# Patient Record
Sex: Female | Born: 1955
Health system: Southern US, Community
[De-identification: ages and names within clinical notes are randomized; demographics above are authoritative.]

## PROBLEM LIST (undated history)

## (undated) ENCOUNTER — Ambulatory Visit (HOSPITAL_BASED_OUTPATIENT_CLINIC_OR_DEPARTMENT_OTHER)

## (undated) DIAGNOSIS — E785 Hyperlipidemia, unspecified: Secondary | ICD-10-CM

## (undated) HISTORY — PX: INGUINAL HERNIA REPAIR: SUR1180

## (undated) HISTORY — DX: Hyperlipidemia, unspecified: E78.5

---

## 2003-10-18 ENCOUNTER — Other Ambulatory Visit: Admission: RE | Admit: 2003-10-18 | Discharge: 2003-10-18 | Payer: Self-pay | Admitting: Obstetrics and Gynecology

## 2004-11-05 ENCOUNTER — Other Ambulatory Visit: Admission: RE | Admit: 2004-11-05 | Discharge: 2004-11-05 | Payer: Self-pay | Admitting: Obstetrics & Gynecology

## 2009-05-09 ENCOUNTER — Ambulatory Visit (HOSPITAL_BASED_OUTPATIENT_CLINIC_OR_DEPARTMENT_OTHER): Admission: RE | Admit: 2009-05-09 | Discharge: 2009-05-09 | Payer: Self-pay | Admitting: Orthopedic Surgery

## 2010-02-13 ENCOUNTER — Encounter
Admission: RE | Admit: 2010-02-13 | Discharge: 2010-02-13 | Payer: Self-pay | Source: Home / Self Care | Attending: Obstetrics & Gynecology | Admitting: Obstetrics & Gynecology

## 2010-05-21 LAB — POCT HEMOGLOBIN-HEMACUE: Hemoglobin: 13.4 g/dL (ref 12.0–15.0)

## 2011-02-14 ENCOUNTER — Other Ambulatory Visit: Payer: Self-pay | Admitting: Obstetrics & Gynecology

## 2011-02-14 DIAGNOSIS — R928 Other abnormal and inconclusive findings on diagnostic imaging of breast: Secondary | ICD-10-CM

## 2011-02-27 ENCOUNTER — Ambulatory Visit
Admission: RE | Admit: 2011-02-27 | Discharge: 2011-02-27 | Disposition: A | Discharge status: Home / Self Care | Payer: BC Managed Care – PPO | Source: Ambulatory Visit | Attending: Obstetrics & Gynecology | Admitting: Obstetrics & Gynecology

## 2011-02-27 ENCOUNTER — Other Ambulatory Visit: Payer: Self-pay | Admitting: Obstetrics & Gynecology

## 2011-02-27 DIAGNOSIS — R928 Other abnormal and inconclusive findings on diagnostic imaging of breast: Secondary | ICD-10-CM

## 2012-05-26 IMAGING — MG MM DIGITAL DIAGNOSTIC UNILAT*L*
3 series · 3 of 3 positions shown · non-contrast
Comparison: With priors

CLINICAL DATA: Abnormal left screening mammogram

DIGITAL DIAGNOSTIC LEFT MAMMOGRAM WITH CAD AND LEFT BREAST
ULTRASOUND:

[L MLO (1 of 2)]
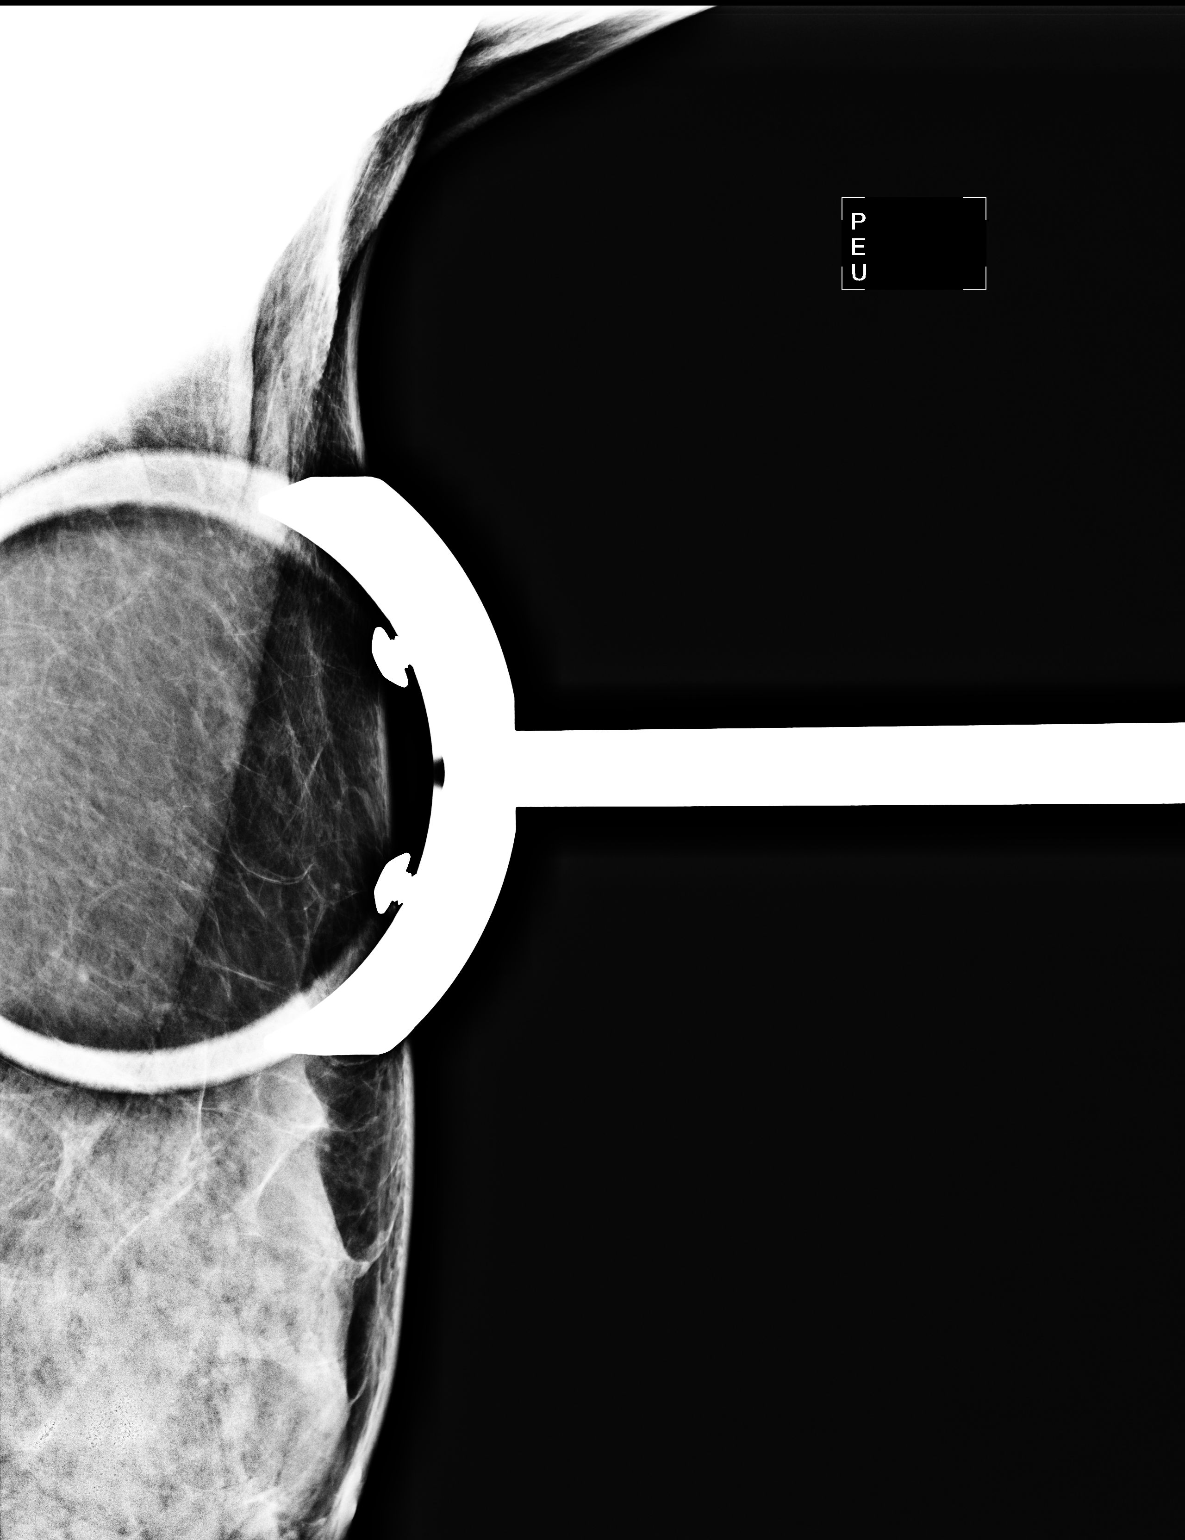

[L XCCL]
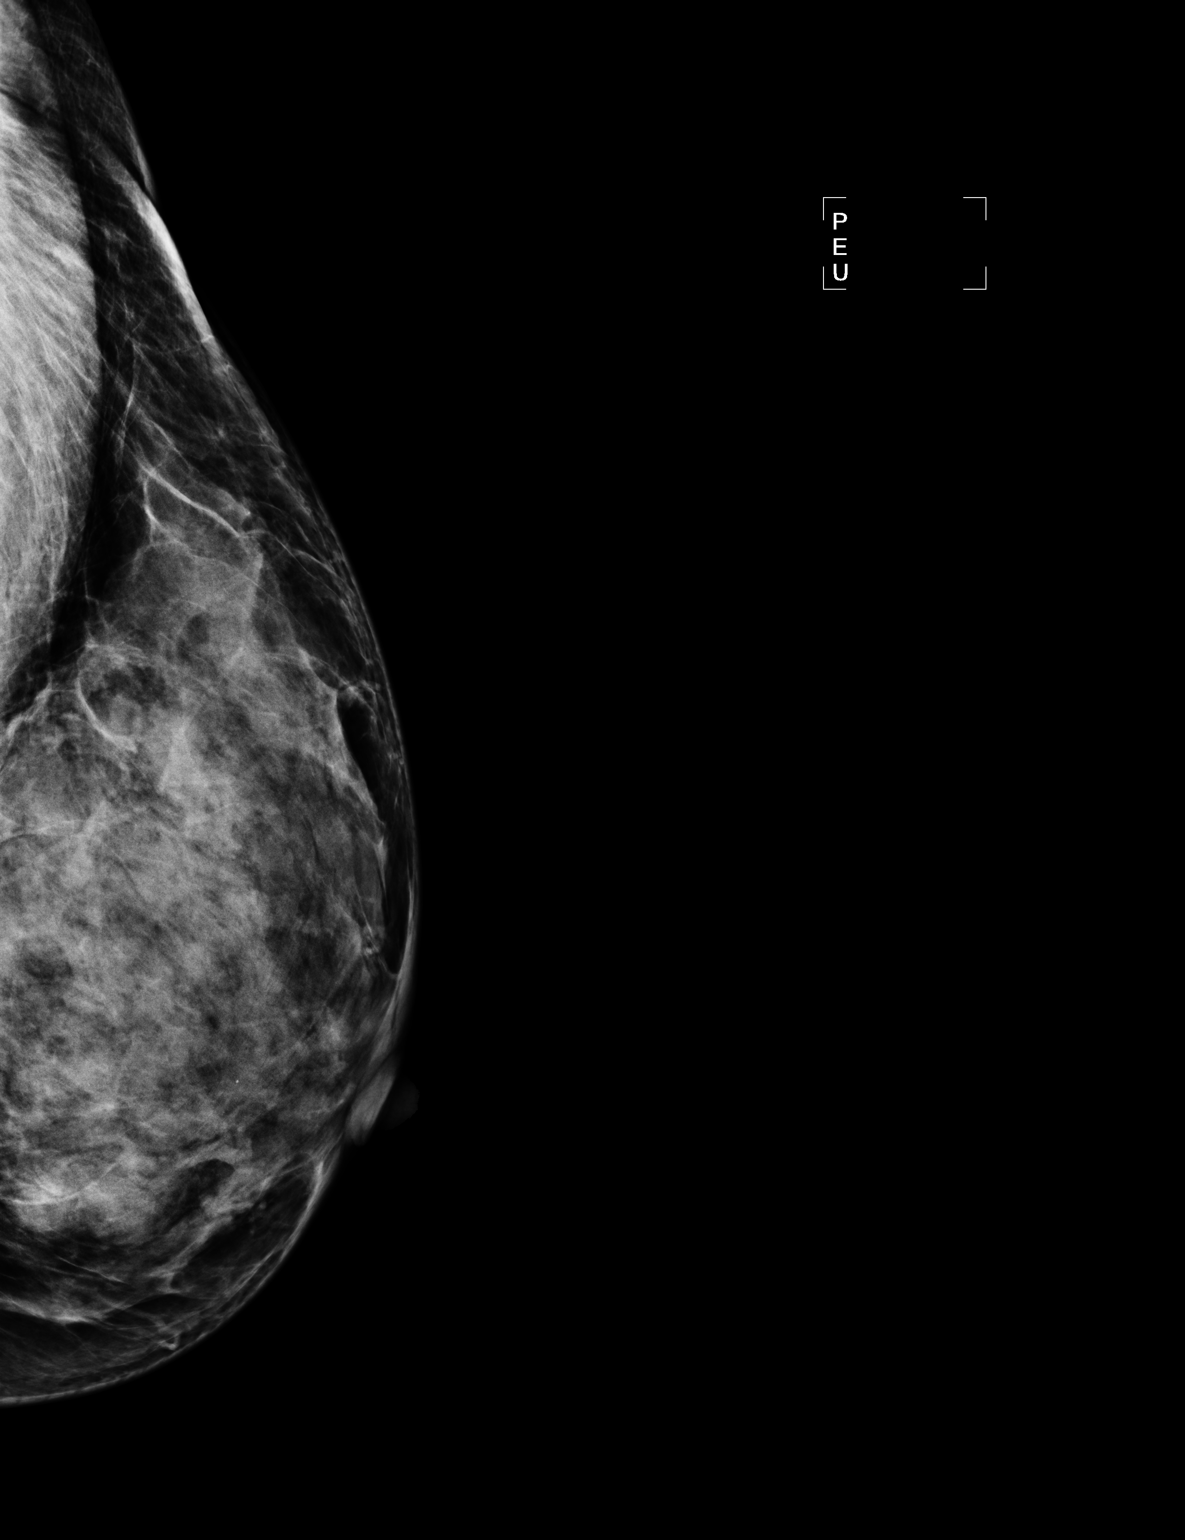

[L MLO (2 of 2)]
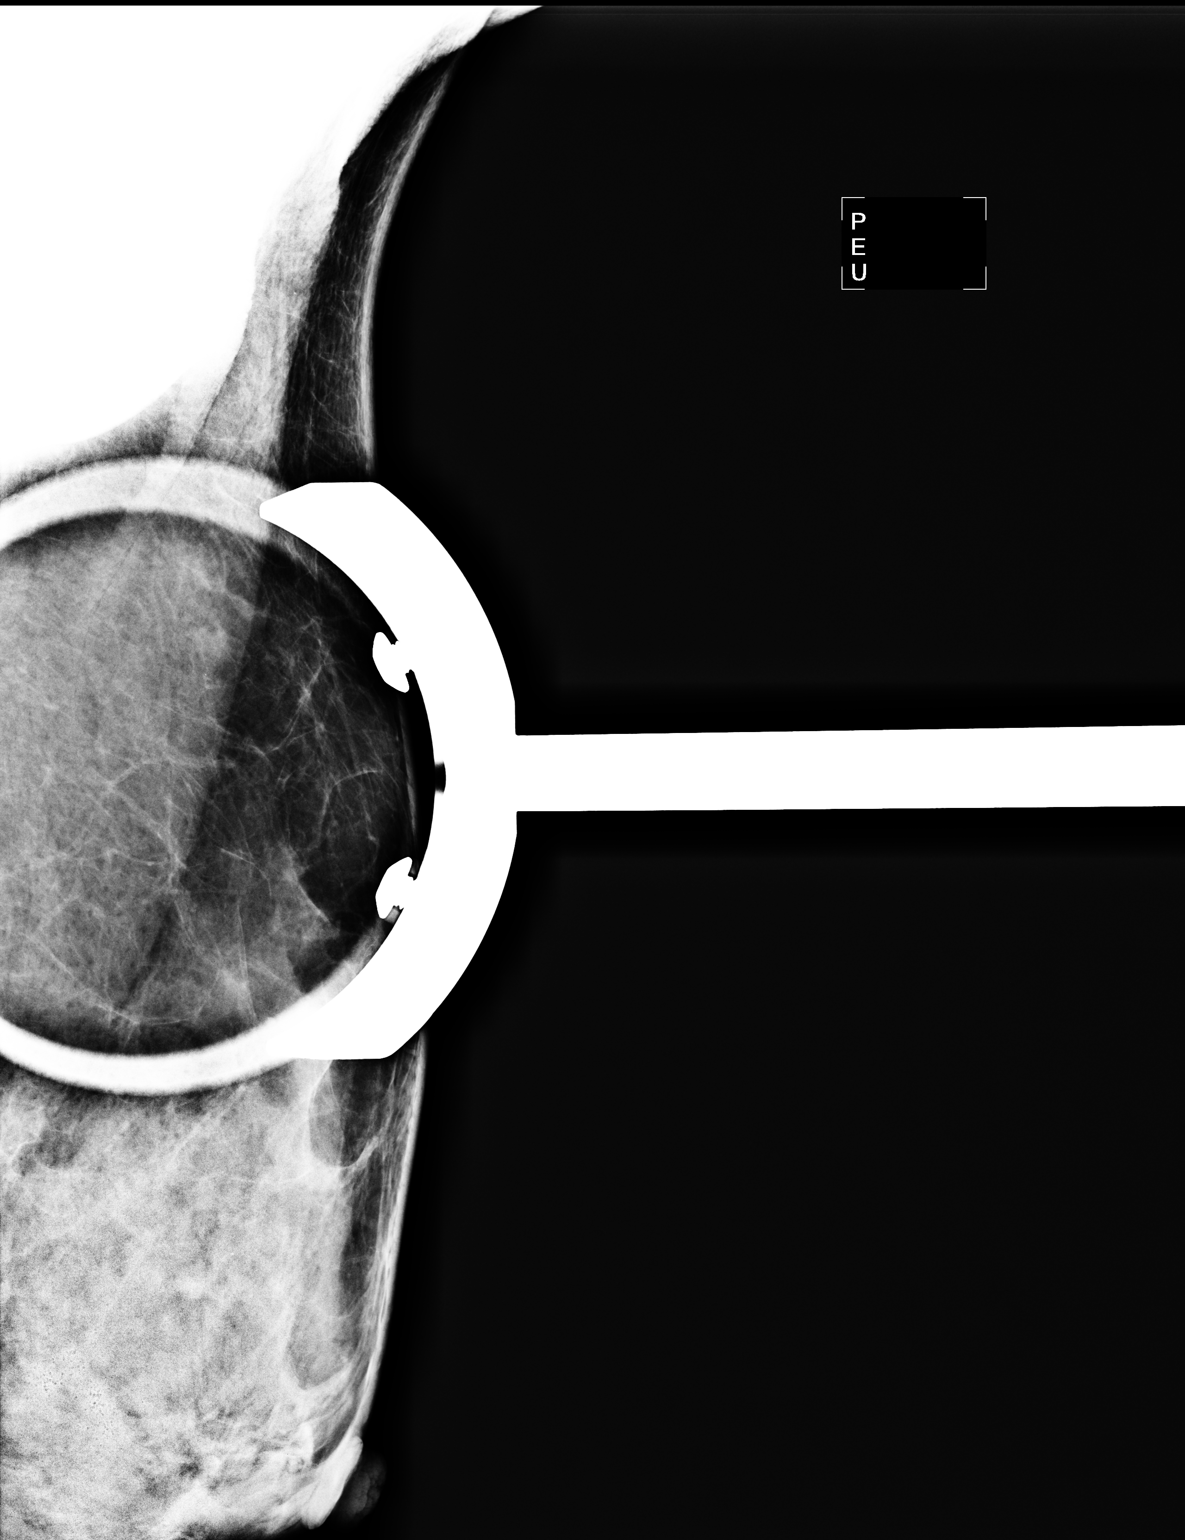

[3 of 3 positions shown; findings below may reference images not displayed]

FINDINGS: Spot compression views of the left breast were performed
as well as an exaggerated CC view.  There is no persistent mass,
distortion or malignant-type microcalcifications.
Mammographic images were processed with CAD.

On physical exam, I do not palpate a mass in the left breast.

Ultrasound is performed, showing a simple cyst in the left breast 4
o'clock 3 cm from the nipple measuring 5 x 4 x 5 mm.  No solid mass
or abnormal shadowing is seen in the left breast.
IMPRESSION: No evidence of malignancy in the left breast.  Screening mammogram
in 1 year is recommended.

BI-RADS CATEGORY 2:  Benign finding(s).

## 2012-05-26 IMAGING — US US BREAST L
1 series · 4 of 4 positions shown · non-contrast
Comparison: With priors

CLINICAL DATA: Abnormal left screening mammogram

DIGITAL DIAGNOSTIC LEFT MAMMOGRAM WITH CAD AND LEFT BREAST
ULTRASOUND:

[Series 1: us breast left · 4 of 4 slices shown]
[im 1/4]
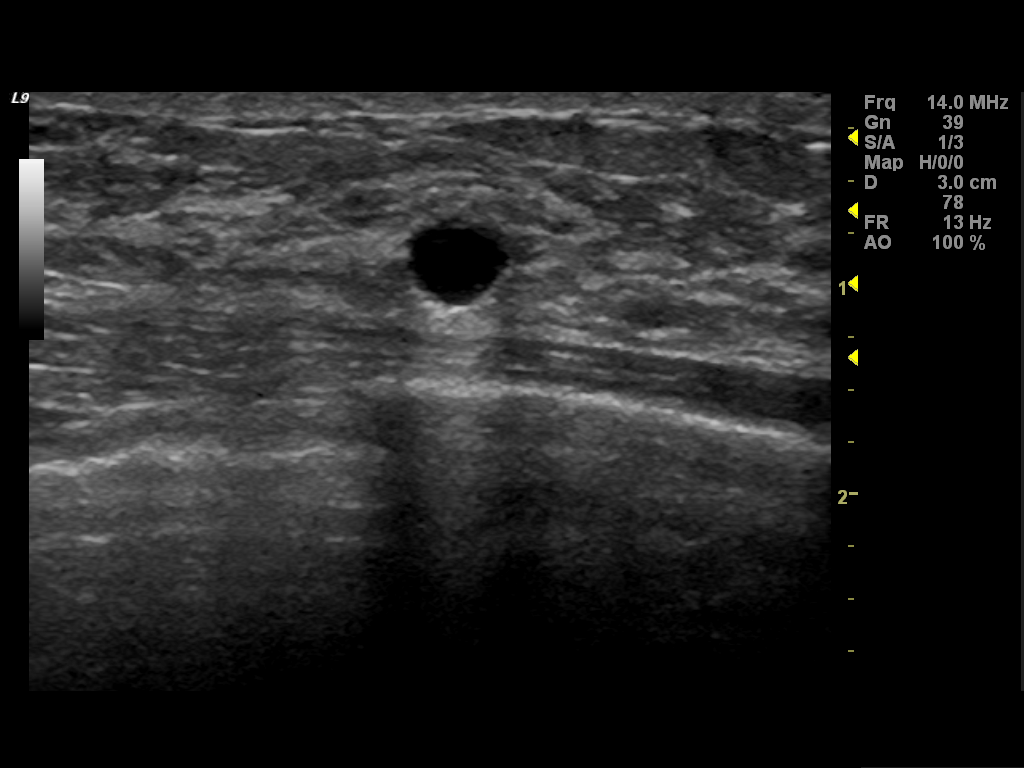
[im 2/4]
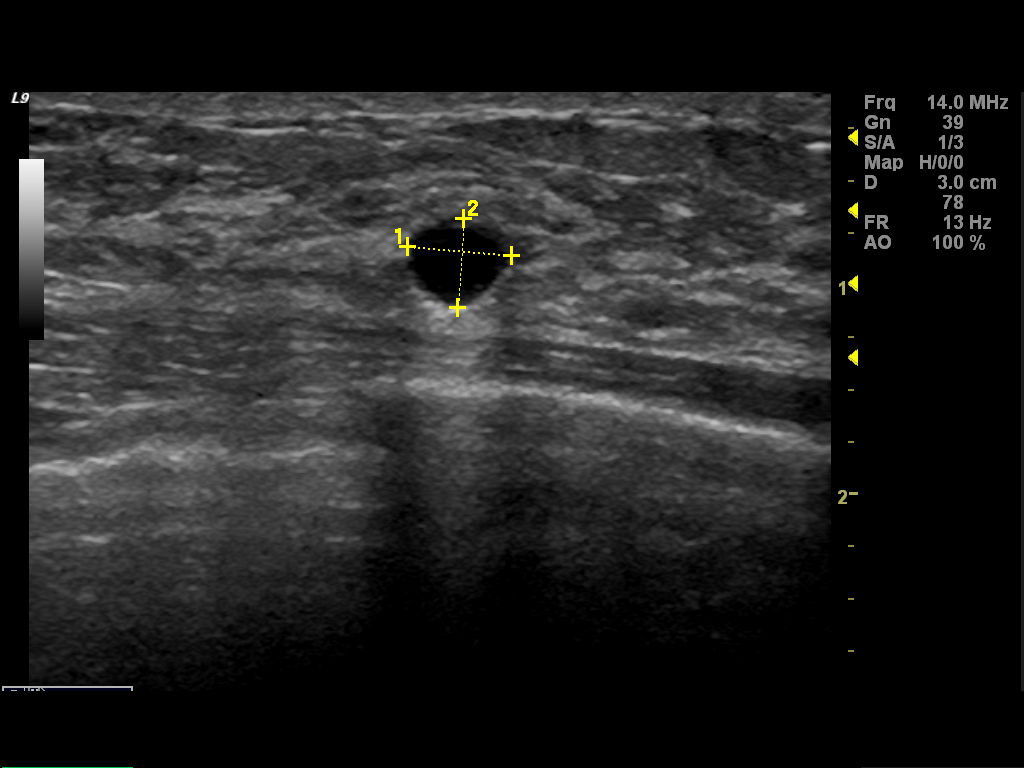
[im 3/4]
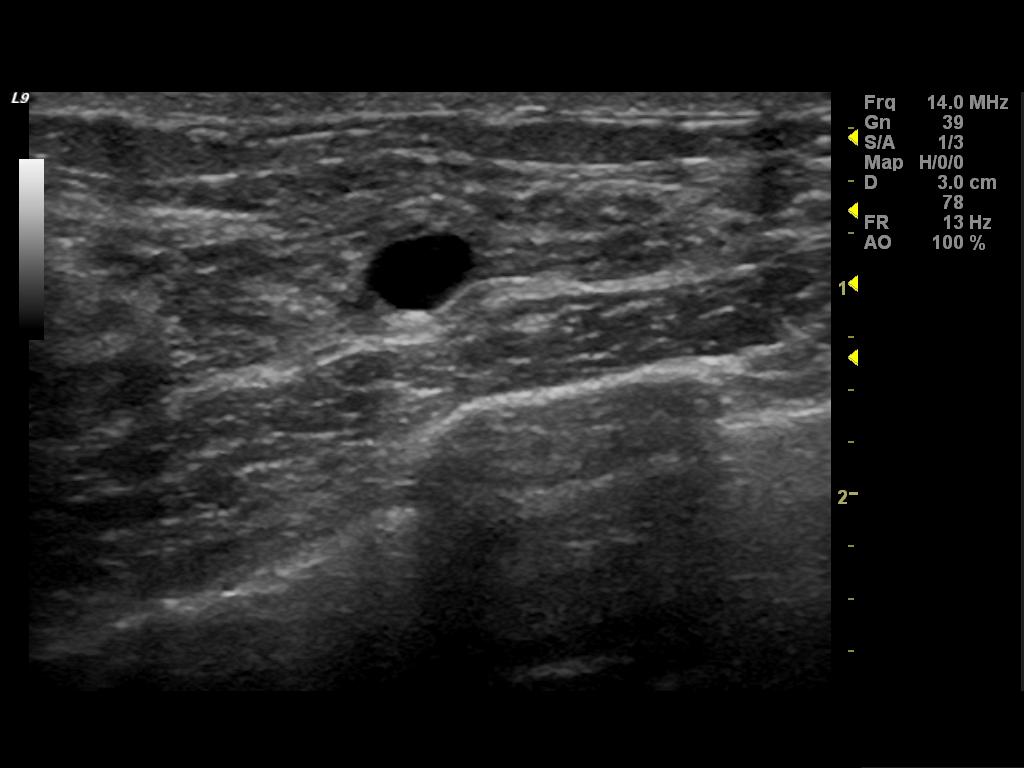
[im 4/4]
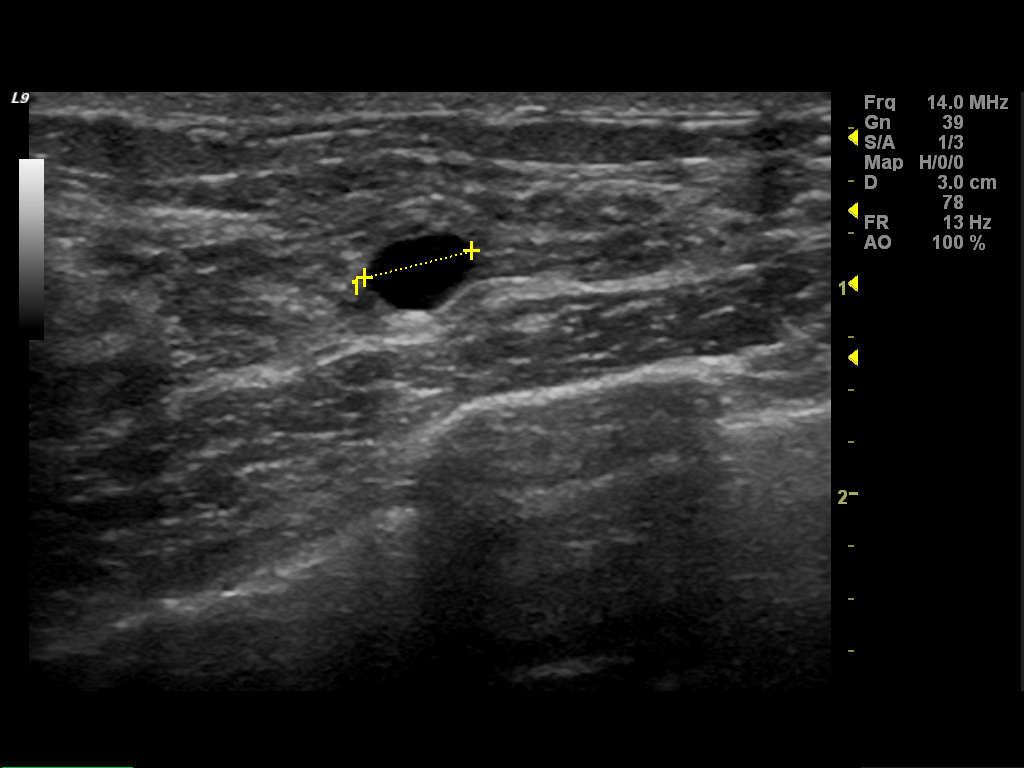

[4 of 4 positions shown; findings below may reference images not displayed]

FINDINGS: Spot compression views of the left breast were performed
as well as an exaggerated CC view.  There is no persistent mass,
distortion or malignant-type microcalcifications.
Mammographic images were processed with CAD.

On physical exam, I do not palpate a mass in the left breast.

Ultrasound is performed, showing a simple cyst in the left breast 4
o'clock 3 cm from the nipple measuring 5 x 4 x 5 mm.  No solid mass
or abnormal shadowing is seen in the left breast.
IMPRESSION: No evidence of malignancy in the left breast.  Screening mammogram
in 1 year is recommended.

BI-RADS CATEGORY 2:  Benign finding(s).

## 2014-05-17 ENCOUNTER — Encounter: Payer: Self-pay | Admitting: *Deleted

## 2014-05-18 ENCOUNTER — Ambulatory Visit (INDEPENDENT_AMBULATORY_CARE_PROVIDER_SITE_OTHER): Payer: BLUE CROSS/BLUE SHIELD | Admitting: *Deleted

## 2014-05-18 DIAGNOSIS — I8393 Asymptomatic varicose veins of bilateral lower extremities: Secondary | ICD-10-CM

## 2014-05-18 NOTE — Progress Notes (Signed)
X=.3% Sotradecol administered with a 27g butterfly.  Patient received a total of 5cc.   Treated majority of her spiders. She may need some CL in the fall to clean up tiny red vessels. Easy access. Tol well. Follow prn.  Photos: No.  Camera malfunctionCompression stockings applied: Yes.

## 2014-06-06 ENCOUNTER — Other Ambulatory Visit: Payer: Self-pay | Admitting: Obstetrics and Gynecology

## 2014-06-08 LAB — CYTOLOGY - PAP

## 2015-03-10 ENCOUNTER — Encounter: Payer: Self-pay | Admitting: *Deleted

## 2015-03-15 ENCOUNTER — Encounter: Payer: Self-pay | Admitting: Vascular Surgery

## 2015-03-15 ENCOUNTER — Ambulatory Visit (INDEPENDENT_AMBULATORY_CARE_PROVIDER_SITE_OTHER): Payer: BLUE CROSS/BLUE SHIELD | Admitting: *Deleted

## 2015-03-15 DIAGNOSIS — I8393 Asymptomatic varicose veins of bilateral lower extremities: Secondary | ICD-10-CM

## 2015-03-15 NOTE — Progress Notes (Signed)
X=.3% Sotradecol administered with a 27g butterfly.  Patient received a total of 5cc.  Cutaneous Laser:pulsed mode  810j/cm2 400 ms delay  13 ms Duration 0.5 spot  Total pulses: 1552 Total energy 2458  Total time::20  Photos: No.  Compression stockings applied: Yes.     Combo of sclero and cutaneous laser. Easy access. Tol well. Anticipate good results. Will follow prn.

## 2015-05-23 ENCOUNTER — Encounter: Payer: Self-pay | Admitting: *Deleted

## 2015-05-31 ENCOUNTER — Ambulatory Visit: Payer: BLUE CROSS/BLUE SHIELD | Admitting: *Deleted

## 2017-04-09 ENCOUNTER — Ambulatory Visit (INDEPENDENT_AMBULATORY_CARE_PROVIDER_SITE_OTHER): Payer: Self-pay | Admitting: *Deleted

## 2017-04-09 DIAGNOSIS — I781 Nevus, non-neoplastic: Secondary | ICD-10-CM

## 2017-04-09 NOTE — Progress Notes (Signed)
X=.3% Sotradecol administered with a 27g butterfly.  Patient received a total of 4cc.  Patient had a good result from her last treatment. Easy access. Tol well. Cleaned up a few new areas. Anticipate good results. Follow prn.     Compression stockings applied: Yes.

## 2017-04-10 ENCOUNTER — Encounter: Payer: Self-pay | Admitting: *Deleted

## 2017-06-05 ENCOUNTER — Ambulatory Visit: Payer: BLUE CROSS/BLUE SHIELD | Admitting: *Deleted

## 2017-06-05 DIAGNOSIS — I781 Nevus, non-neoplastic: Secondary | ICD-10-CM

## 2017-06-05 NOTE — Progress Notes (Signed)
Pt has had a good result. Just had a few red vessels which I treated with the CL at [facility]. Follow prn.

## 2017-07-30 ENCOUNTER — Ambulatory Visit: Payer: BLUE CROSS/BLUE SHIELD | Admitting: *Deleted

## 2017-07-30 DIAGNOSIS — I781 Nevus, non-neoplastic: Secondary | ICD-10-CM

## 2017-07-30 NOTE — Progress Notes (Signed)
Patient came in today to have me check the results of her recent CL tx. Does not appear to have woked on her larger capillaries. Told her one syringe should take care of her vessels. She will call for an appt in the fall. Follow prn.

## 2017-12-29 ENCOUNTER — Ambulatory Visit (INDEPENDENT_AMBULATORY_CARE_PROVIDER_SITE_OTHER): Payer: BLUE CROSS/BLUE SHIELD

## 2017-12-29 ENCOUNTER — Ambulatory Visit: Payer: BLUE CROSS/BLUE SHIELD | Admitting: Podiatry

## 2017-12-29 ENCOUNTER — Other Ambulatory Visit: Payer: Self-pay

## 2017-12-29 VITALS — BP 117/79 | HR 73

## 2017-12-29 DIAGNOSIS — M21619 Bunion of unspecified foot: Secondary | ICD-10-CM

## 2017-12-29 DIAGNOSIS — M21612 Bunion of left foot: Secondary | ICD-10-CM | POA: Diagnosis not present

## 2017-12-29 DIAGNOSIS — M21611 Bunion of right foot: Secondary | ICD-10-CM | POA: Diagnosis not present

## 2017-12-29 NOTE — Patient Instructions (Signed)
Pre-Operative Instructions  Congratulations, you have decided to take an important step towards improving your quality of life.  You can be assured that the doctors and staff at [facility] will be with you every step of the way.  Here are some important things you should know:  1. Plan to be at the surgery center/hospital at least 1 (one) hour prior to your scheduled time, unless otherwise directed by the surgical center/hospital staff.  You must have a responsible adult accompany you, remain during the surgery and drive you home.  Make sure you have directions to the surgical center/hospital to ensure you arrive on time. 2. If you are having surgery at [facility] or [facility], you will need a copy of your medical history and physical form from your family physician within one month prior to the date of surgery. We will give you a form for your primary physician to complete.  3. We make every effort to accommodate the date you request for surgery.  However, there are times where surgery dates or times have to be moved.  We will contact you as soon as possible if a change in schedule is required.   4. No aspirin/ibuprofen for one week before surgery.  If you are on aspirin, any non-steroidal anti-inflammatory medications (Mobic, Aleve, Ibuprofen) should not be taken seven (7) days prior to your surgery.  You make take Tylenol for pain prior to surgery.  5. Medications - If you are taking daily heart and blood pressure medications, seizure, reflux, allergy, asthma, anxiety, pain or diabetes medications, make sure you notify the surgery center/hospital before the day of surgery so they can tell you which medications you should take or avoid the day of surgery. 6. No food or drink after midnight the night before surgery unless directed otherwise by surgical center/hospital staff. 7. No alcoholic beverages 24-hours prior to surgery.  No smoking 24-hours prior or 24-hours after  surgery. 8. Wear loose pants or shorts. They should be loose enough to fit over bandages, boots, and casts. 9. Don't wear slip-on shoes. Sneakers are preferred. 10. Bring your boot with you to the surgery center/hospital.  Also bring crutches or a walker if your physician has prescribed it for you.  If you do not have this equipment, it will be provided for you after surgery. 11. If you have not been contacted by the surgery center/hospital by the day before your surgery, call to confirm the date and time of your surgery. 12. Leave-time from work may vary depending on the type of surgery you have.  Appropriate arrangements should be made prior to surgery with your employer. 13. Prescriptions will be provided immediately following surgery by your doctor.  Fill these as soon as possible after surgery and take the medication as directed. Pain medications will not be refilled on weekends and must be approved by the doctor. 14. Remove nail polish on the operative foot and avoid getting pedicures prior to surgery. 15. Wash the night before surgery.  The night before surgery wash the foot and leg well with water and the antibacterial soap provided. Be sure to pay special attention to beneath the toenails and in between the toes.  Wash for at least three (3) minutes. Rinse thoroughly with water and dry well with a towel.  Perform this wash unless told not to do so by your physician.  Enclosed: 1 Ice pack (please put in freezer the night before surgery)   1 Hibiclens skin cleaner     Pre-op instructions  If you have any questions regarding the instructions, please do not hesitate to call our office.  [place]: [address], [place], [place] [address] -- [phone]  [place]: [address] [place], [place] [address] -- [phone]  [place]: [address]  [place], [place] [address] -- [phone]  [place]: [address], [address], [place],  [address] -- [phone]  Website:  [email or web address] 

## 2017-12-30 NOTE — Progress Notes (Signed)
   Subjective: 62 year old female presenting today as a new patient with a chief complaint of bilateral bunions that have been present for the past several years. She states the pain in the right foot is worse than the left. Wearing shoes and walking increases the pain. She has not done anything for treatment. Patient is here for further evaluation and treatment.   No past medical history on file.    Objective: Physical Exam General: The patient is alert and oriented x3 in no acute distress.  Dermatology: Skin is cool, dry and supple bilateral lower extremities. Negative for open lesions or macerations.  Vascular: Palpable pedal pulses bilaterally. No edema or erythema noted. Capillary refill within normal limits.  Neurological: Epicritic and protective threshold grossly intact bilaterally.   Musculoskeletal Exam: Clinical evidence of bunion deformity noted to the respective foot. There is a moderate pain on palpation range of motion of the first MPJ. Lateral deviation of the hallux noted consistent with hallux abductovalgus.  Radiographic Exam: Increased intermetatarsal angle greater than 15 with a hallux abductus angle greater than 30 noted on AP view. Moderate degenerative changes noted within the first MPJ.  Assessment: 1. HAV w/ bunion deformity bilateral right greater than left     Plan of Care:  1. Patient was evaluated. X-Rays reviewed. 2. Today we discussed the conservative versus surgical management of the presenting pathology. The patient opts for surgical management. All possible complications and details of the procedure were explained. All patient questions were answered. No guarantees were expressed or implied. 3. Authorization for surgery was initiated today. Surgery will consist of bunionectomy with metatarsal osteotomy right.  4. Return to clinic one week post op.   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     Edrick Kins, DPM [facility]  Dr. Edrick Kins, [place]                                        [place], [place] [address]                Office [phone]  Fax [phone]

## 2018-02-11 ENCOUNTER — Telehealth: Payer: Self-pay | Admitting: *Deleted

## 2018-02-11 NOTE — Telephone Encounter (Signed)
"  I am calling to schedule my surgery with the cute brown headed doctor from [place]."  Are you referring to Dr. Amalia Hailey.  "Yes, I could not think of his name."  Have you signed your consent forms yet?  "No, I have not signed anything that I can recall."  Upon looking at your chart, I see you have signed your consent forms.  Do you have a date in mine that you would like, he does surgeries on Thursdays?  "I'd like to do it on February 6.  Will you check my insurance and let me know how much I will have to pay or does someone else do that?"  I will get Jocelyn Lamer in our insurance department to give you a call with that information.  You will need to call the surgical center to get the facility and anesthesia fees.  You are having an [employer] and your procedure will take one hour.  "Do I need to come back in to see him to get all the instructions?  It's been over a month since I have seen him."  Your instructions were given to you on your after visit summary that we gave you.  You cannot eat/drink anything after midnight the night before your surgery date.  Someone will need to go with you for the surgery.  You will need to clean your foot the night before with the scrub brush that we gave you.  The gel pack is to be used as an ice pack after your surgery.  You will use it 15 minutes per hour the first day, then afterwards, about four times a day.  Someone from the surgical center will give you a call a day or two prior to your surgery date and they will give you your arrival time.  You need to register online with the surgical center, the instructions on how to do that are in the brochure that we gave you from the surgical center.  "Okay, thank you for your help."  (Estimate for an [employer] - 1 hour)

## 2018-03-10 ENCOUNTER — Telehealth: Payer: Self-pay | Admitting: *Deleted

## 2018-03-10 NOTE — Telephone Encounter (Signed)
"  I'm scheduled to have surgery on February 6 for a bunion.  I have another place on my foot, I believe it's a corn.  I didn't know if I need to come in for reevaluation to see if there's something else that needs to be done.  So I can have it all done at one time.  Give me a call and let me know what I need to do."

## 2018-03-12 NOTE — Telephone Encounter (Signed)
"  I'm scheduled for surgery on February 6.  I have noticed I have developed a corn on my toe.  I want to have everything done on my foot at one time.  Should I see him in regards to this toe?"  Yes, you will need to see him.  "Will I have to pay another co-pay?"  Yes, you will have to pay another co-pay.  Would you like me to transfer you to a scheduler to make that appointment?  "Yes, that will be good."

## 2018-03-18 ENCOUNTER — Ambulatory Visit: Payer: BLUE CROSS/BLUE SHIELD | Admitting: Podiatry

## 2018-03-18 DIAGNOSIS — M21621 Bunionette of right foot: Secondary | ICD-10-CM | POA: Diagnosis not present

## 2018-03-18 DIAGNOSIS — M79671 Pain in right foot: Secondary | ICD-10-CM

## 2018-03-18 DIAGNOSIS — M21619 Bunion of unspecified foot: Secondary | ICD-10-CM | POA: Diagnosis not present

## 2018-03-26 ENCOUNTER — Telehealth: Payer: Self-pay | Admitting: *Deleted

## 2018-03-26 NOTE — Telephone Encounter (Signed)
"  I'm scheduled for surgery on February 11.  I work a part time job.  My boss wants to know how long I'm going to be out of work."  Do you stand on your job?  "Sometimes I do."  You will need to be out of work for about six to eight weeks.  "Can I be out of work for about a month?"  I can't guarantee that, it's usually six to eight weeks.  "Someone will call me about my time, correct?"  Yes, that is correct.  Someone will give you a call from the surgical center a day or two prior to your surgery date and they will give you your time.

## 2018-03-29 NOTE — Progress Notes (Signed)
   Subjective: 63 year old female presenting today with a new complaint of discomfort to the right fifth MPJ that began about one month ago. Wearing shoes increases the pain. She has been resting the foot for treatment. Patient is here for further evaluation and treatment.   No past medical history on file.    Objective: Physical Exam General: The patient is alert and oriented x3 in no acute distress.  Dermatology: Skin is cool, dry and supple bilateral lower extremities. Negative for open lesions or macerations.  Vascular: Palpable pedal pulses bilaterally. No edema or erythema noted. Capillary refill within normal limits.  Neurological: Epicritic and protective threshold grossly intact bilaterally.   Musculoskeletal Exam: Clinical evidence of bunion deformity noted to the respective foot. There is a moderate pain on palpation range of motion of the first MPJ. Lateral deviation of the hallux noted consistent with hallux abductovalgus. Clinical evidence of Tailor's bunion deformity noted to the respective foot. There is a moderate pain on palpation range of motion of the fifth MPJ.  Assessment: 1. 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 Tailors bunion right    Plan of Care:  1. Patient was evaluated. 2. Authorization for surgery adjusted today to address Tailors bunion. Authorization for surgery reinitiated. Surgery will consist of bunionectomy with metatarsal osteotomy right; Tailors bunionectomy with 5th metatarsal osteotomy right.  3. Return to clinic one week post op.   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     Edrick Kins, DPM [facility]  Dr. Edrick Kins, [place]                                        [place], [place] [address]                Office [phone]  Fax [phone]

## 2018-03-30 ENCOUNTER — Telehealth: Payer: Self-pay | Admitting: *Deleted

## 2018-03-30 NOTE — Telephone Encounter (Signed)
"  I am calling to see if I really need to remove my nail polish.  How strict are they about that?"  Yes, you do need to remove your toenail polish.  The reason is that leaving the nail polish on can lead to infection.  "So I can leave it on my hands just not my toenails?"  Yes, that is correct.

## 2018-04-02 ENCOUNTER — Other Ambulatory Visit: Payer: Self-pay | Admitting: Podiatry

## 2018-04-02 ENCOUNTER — Encounter: Payer: Self-pay | Admitting: Podiatry

## 2018-04-02 DIAGNOSIS — M2011 Hallux valgus (acquired), right foot: Secondary | ICD-10-CM

## 2018-04-02 DIAGNOSIS — M21541 Acquired clubfoot, right foot: Secondary | ICD-10-CM

## 2018-04-02 MED ORDER — HYDROCODONE-ACETAMINOPHEN 5-325 MG PO TABS: 1.0000 | ORAL_TABLET | Freq: Four times a day (QID) | ORAL | 0 refills | Status: DC | PRN

## 2018-04-02 NOTE — Progress Notes (Signed)
.  postop

## 2018-04-08 ENCOUNTER — Ambulatory Visit (INDEPENDENT_AMBULATORY_CARE_PROVIDER_SITE_OTHER): Payer: Self-pay | Admitting: Podiatry

## 2018-04-08 ENCOUNTER — Ambulatory Visit (INDEPENDENT_AMBULATORY_CARE_PROVIDER_SITE_OTHER): Payer: BLUE CROSS/BLUE SHIELD

## 2018-04-08 VITALS — BP 118/73 | Temp 96.9°F

## 2018-04-08 DIAGNOSIS — M79671 Pain in right foot: Secondary | ICD-10-CM

## 2018-04-08 DIAGNOSIS — M21621 Bunionette of right foot: Secondary | ICD-10-CM

## 2018-04-08 DIAGNOSIS — Z09 Encounter for follow-up examination after completed treatment for conditions other than malignant neoplasm: Secondary | ICD-10-CM

## 2018-04-13 NOTE — Progress Notes (Signed)
   Subjective:  Patient presents today status post bunionectomy and Tailor's bunionectomy right. DOS: 04/02/2018. She states she is doing well. She reports some intermittent pain but denies any modifying factors. She has been using the CAM boot as directed. Patient is here for further evaluation and treatment.    No past medical history on file.    Objective/Physical Exam Neurovascular status intact.  Skin incisions appear to be well coapted with sutures and staples intact. No sign of infectious process noted. No dehiscence. No active bleeding noted. Moderate edema noted to the surgical extremity.  Radiographic Exam:  Orthopedic hardware and osteotomies sites appear to be stable with routine healing.  Assessment: 1. s/p bunionectomy and Tailor's bunionectomy right. DOS: 04/02/2018   Plan of Care:  1. Patient was evaluated. X-rays reviewed 2. Dressing changed. Keep clean, dry and intact for one week.  3. Continue weightbearing in CAM boot.  4. Return to clinic in one week.    Edrick Kins, DPM [facility]  Dr. Edrick Kins, [place]                                        [place], [place] [address]                Office [phone]  Fax [phone]

## 2018-04-20 ENCOUNTER — Ambulatory Visit (INDEPENDENT_AMBULATORY_CARE_PROVIDER_SITE_OTHER): Payer: BLUE CROSS/BLUE SHIELD | Admitting: Podiatry

## 2018-04-20 DIAGNOSIS — Z09 Encounter for follow-up examination after completed treatment for conditions other than malignant neoplasm: Secondary | ICD-10-CM

## 2018-04-20 DIAGNOSIS — M21621 Bunionette of right foot: Secondary | ICD-10-CM

## 2018-04-20 DIAGNOSIS — M21619 Bunion of unspecified foot: Secondary | ICD-10-CM

## 2018-04-22 NOTE — Progress Notes (Signed)
   Subjective:  Patient presents today status post bunionectomy and Tailor's bunionectomy right. DOS: 04/02/2018. She states she is doing well overall. She denies any significant pain or modifying factors. She has been using the CAM boot as directed. Patient is here for further evaluation and treatment.    No past medical history on file.    Objective/Physical Exam Neurovascular status intact.  Skin incisions appear to be well coapted with sutures and staples intact. No sign of infectious process noted. No dehiscence. No active bleeding noted. Moderate edema noted to the surgical extremity.  Radiographic Exam:  Orthopedic hardware and osteotomies sites appear to be stable with routine healing.  Assessment: 1. s/p bunionectomy and Tailor's bunionectomy right. DOS: 04/02/2018   Plan of Care:  1. Patient was evaluated. X-rays reviewed 2. Sutures removed.  3. Post op shoe dispensed. Discontinue using CAM boot.  4. Compression anklet dispensed.  5. Return to clinic in 2 weeks.    Edrick Kins, DPM [facility]  Dr. Edrick Kins, [place]                                        [place], [place] [address]                Office [phone]  Fax [phone]

## 2018-05-04 ENCOUNTER — Ambulatory Visit (INDEPENDENT_AMBULATORY_CARE_PROVIDER_SITE_OTHER): Payer: BLUE CROSS/BLUE SHIELD | Admitting: Podiatry

## 2018-05-04 ENCOUNTER — Encounter: Payer: Self-pay | Admitting: Podiatry

## 2018-05-04 DIAGNOSIS — Z09 Encounter for follow-up examination after completed treatment for conditions other than malignant neoplasm: Secondary | ICD-10-CM

## 2018-05-04 DIAGNOSIS — M21621 Bunionette of right foot: Secondary | ICD-10-CM

## 2018-05-04 DIAGNOSIS — M21619 Bunion of unspecified foot: Secondary | ICD-10-CM

## 2018-05-07 NOTE — Progress Notes (Signed)
   Subjective:  Patient presents today status post bunionectomy and Tailor's bunionectomy right. DOS: 04/02/2018. She states she is doing well overall. She denies any pain or modifying factors. She has been using the CAM boot as directed. Patient is here for further evaluation and treatment.    History reviewed. No pertinent past medical history.    Objective/Physical Exam Neurovascular status intact.  Skin incisions appear to be well coapted. No sign of infectious process noted. No dehiscence. No active bleeding noted. Moderate edema noted to the surgical extremity.  Assessment: 1. s/p bunionectomy and Tailor's bunionectomy right. DOS: 04/02/2018   Plan of Care:  1. Patient was evaluated. 2. Continue weightbearing in CAM boot for two weeks. Then transition into sneakers.  3. Begin ROM exercises to great toe.  4. Continue using compression anklet.  5. Return to clinic in 4 weeks.    Edrick Kins, DPM [facility]  Dr. Edrick Kins, [place]                                        [place], [place] [address]                Office [phone]  Fax [phone]

## 2018-05-20 ENCOUNTER — Ambulatory Visit (INDEPENDENT_AMBULATORY_CARE_PROVIDER_SITE_OTHER): Payer: Self-pay | Admitting: Plastic Surgery

## 2018-05-20 ENCOUNTER — Other Ambulatory Visit: Payer: Self-pay

## 2018-05-20 ENCOUNTER — Ambulatory Visit (INDEPENDENT_AMBULATORY_CARE_PROVIDER_SITE_OTHER): Payer: BLUE CROSS/BLUE SHIELD | Admitting: Plastic Surgery

## 2018-05-20 ENCOUNTER — Encounter: Payer: Self-pay | Admitting: Plastic Surgery

## 2018-05-20 VITALS — BP 127/86 | HR 79 | Temp 98.5°F | Ht 64.0 in | Wt 146.0 lb

## 2018-05-20 DIAGNOSIS — D1721 Benign lipomatous neoplasm of skin and subcutaneous tissue of right arm: Secondary | ICD-10-CM | POA: Insufficient documentation

## 2018-05-20 DIAGNOSIS — Z719 Counseling, unspecified: Secondary | ICD-10-CM | POA: Insufficient documentation

## 2018-05-20 NOTE — Progress Notes (Signed)
Patient ID: Vanessa Harris, female    DOB: 18-Jun-1955, 63 y.o.   MRN: [id number]   Chief Complaint  Patient presents with  . Skin Problem    The patient is a 63 year old female here for evaluation of her right arm mass.  The patient states that she had a mass in that area in the past.  It was removed by a different physician.  She noticed it growing over the past year.  It is now 6 x 6 cm and seems to be getting larger.  It does not appear to be infected.  It is soft slightly mobile.  It is most likely a lipoma.  Its not painful but she is concerned with the increase in size.  She denies any trauma.   Review of Systems  Constitutional: Negative.   HENT: Negative.   Eyes: Negative.   Respiratory: Negative.  Negative for shortness of breath.   Cardiovascular: Negative.  Negative for leg swelling.  Gastrointestinal: Negative.  Negative for abdominal pain.  Endocrine: Negative.   Genitourinary: Negative.   Musculoskeletal: Negative.  Negative for joint swelling.  Neurological: Negative.  Negative for facial asymmetry.  Hematological: Negative.   Psychiatric/Behavioral: Negative.     History reviewed. No pertinent past medical history.  History reviewed. No pertinent surgical history.    Current Outpatient Medications:  .  buPROPion (WELLBUTRIN SR) 150 MG 12 hr tablet, TK 1 T PO BID, Disp: , Rfl: 11 .  estradiol (VIVELLE-DOT) 0.05 MG/24HR patch, , Disp: , Rfl: 7 .  FABIOR 0.1 % FOAM, APPLY TO AFFECTED AREAS EVERY NIGHT AT BEDTIME, Disp: , Rfl: 2 .  fluconazole (DIFLUCAN) 100 MG tablet, TK 1 T PO D, Disp: , Rfl:  .  HYDROcodone-acetaminophen (NORCO/VICODIN) 5-325 MG tablet, Take 1 tablet by mouth every 6 (six) hours as needed for moderate pain. (Patient not taking: Reported on 05/20/2018), Disp: 30 tablet, Rfl: 0 .  ivermectin (STROMECTOL) 3 MG TABS tablet, , Disp: , Rfl:  .  mupirocin ointment (BACTROBAN) 2 %, APP AA TID FOR 10 TO 14 DAYS, Disp: , Rfl: 0 .  permethrin  (ELIMITE) 5 % cream, , Disp: , Rfl:  .  progesterone (PROMETRIUM) 100 MG capsule, , Disp: , Rfl: 10 .  spironolactone (ALDACTONE) 50 MG tablet, TK 1 T PO  QD, Disp: , Rfl: 7 .  Vitamin D, Ergocalciferol, (DRISDOL) 50000 units CAPS capsule, TAKE 1 CAPSULE PO ONCE WEEKLY, Disp: , Rfl: 3   Objective:   Vitals:   05/20/18 1603  BP: 127/86  Pulse: 79  Temp: 98.5 F (36.9 C)  SpO2: 98%    Physical Exam Vitals signs and nursing note reviewed.  Constitutional:      Appearance: Normal appearance.  HENT:     Head: Normocephalic and atraumatic.     Nose: Nose normal.     Mouth/Throat:     Mouth: Mucous membranes are moist.  Eyes:     Extraocular Movements: Extraocular movements intact.  Cardiovascular:     Rate and Rhythm: Normal rate.  Pulmonary:     Effort: Pulmonary effort is normal.  Abdominal:     General: Abdomen is flat. There is no distension.     Tenderness: There is no abdominal tenderness.  Musculoskeletal:       Arms:  Skin:    General: Skin is warm.  Neurological:     General: No focal deficit present.     Mental Status: She is alert and oriented to person,  place, and time.  Psychiatric:        Mood and Affect: Mood normal.        Behavior: Behavior normal.     Assessment & Plan:  Lipoma of right upper extremity Recommend excision of right arm lipoma.  [place], DO

## 2018-05-20 NOTE — Progress Notes (Signed)
Botulinum Toxin Procedure Note  Procedure: Cosmetic botulinum toxin   Pre-operative Diagnosis: Dynamic rhytides   Post-operative Diagnosis: Same  Complications:  None  Brief history: The patient desires botulinum toxin injection of her forehead. I discussed with the patient this proposed procedure of botulinum toxin injections, which is customized depending on the particular needs of the patient. It is performed on facial rhytids as a temporary correction. The alternatives were discussed with the patient. The risks were addressed including bleeding, scarring, infection, damage to deeper structures, asymmetry, and chronic pain, which may occur infrequently after a procedure. The individual's choice to undergo a surgical procedure is based on the comparison of risks to potential benefits. Other risks include unsatisfactory results, brow ptosis, eyelid ptosis, allergic reaction, temporary paralysis, which should go away with time, bruising, blurring disturbances and delayed healing. Botulinum toxin injections do not arrest the aging process or produce permanent tightening of the eyelid.  Operative intervention maybe necessary to maintain the results of a blepharoplasty or botulinum toxin. The patient understands and wishes to proceed. An informed consent was signed and informational brochures given to her prior to the procedure.  Procedure: The area was prepped with alcohol and dried with a clean gauze. Using a clean technique, the botulinum toxin was diluted with 1.25 cc of preservative-free normal saline which was slowly injected with an 18 gauge needle in a tuberculin syringes.  A 30 gauge needles were then used to inject the botulinum toxin. This mixture allow for an aliquot of 5 units per 0.1 cc in each injection site.    Subsequently the mixture was injected in the glabellar area. Then the lateral eyebrow as well as into each lateral canthal areas. A total of 15 Units of botulinum toxin was used. No  complications were noted. Light pressure was held for 5 minutes. She was instructed explicitly in post-operative care.  Botox LOT:  [id number] C2 EXP:  8/22

## 2018-05-20 NOTE — Progress Notes (Signed)
   Subjective:    Patient ID: Vanessa Harris, female    DOB: 04/10/1955, 63 y.o.   MRN: [id number]  The patient is a 63 year old female here for evaluation of her face.  She had a neck lift with a doctor in [place] 3 years ago.  She does not like the wrinkling around her periorbital area that she has developed in the past couple of years.  She also had laser but she is not sure if it was [place] or if it was a CO2 laser.  She is not interested in surgery but would like to have minimally invasive procedures. She has hyperpigmentation of the face and lower extremities.  She is interested in laser.   Review of Systems  Constitutional: Negative.  Negative for activity change and appetite change.  HENT: Negative.   Eyes: Negative.   Respiratory: Negative.   Cardiovascular: Negative.   Gastrointestinal: Negative.   Genitourinary: Negative.   Musculoskeletal: Negative.   Skin: Positive for color change. Negative for wound.  Hematological: Negative.   Psychiatric/Behavioral: Negative.        Objective:   Physical Exam Vitals signs and nursing note reviewed.  Constitutional:      Appearance: Normal appearance.  HENT:     Head: Normocephalic and atraumatic.  Cardiovascular:     Rate and Rhythm: Normal rate.  Pulmonary:     Effort: Pulmonary effort is normal.  Skin:    General: Skin is warm.  Neurological:     General: No focal deficit present.     Mental Status: She is alert and oriented to person, place, and time.  Psychiatric:        Mood and Affect: Mood normal.        Behavior: Behavior normal.        Assessment & Plan:  Encounter for counseling Botox given Quote for laser of legs and face. Elta MD face cream and eye cream with [facility]ente.

## 2018-05-26 ENCOUNTER — Institutional Professional Consult (permissible substitution): Payer: BLUE CROSS/BLUE SHIELD | Admitting: Plastic Surgery

## 2018-06-08 ENCOUNTER — Encounter: Payer: BLUE CROSS/BLUE SHIELD | Admitting: Podiatry

## 2018-06-09 ENCOUNTER — Telehealth: Payer: Self-pay | Admitting: Plastic Surgery

## 2018-06-09 NOTE — Telephone Encounter (Signed)
Called patient to confirm appointment scheduled for tomorrow. Patient answered the following questions: °1.Has the patient traveled outside of the state of [place] at all within the past 6 weeks? No °2.Does the patient have a fever or cough at all? No °3.Has the patient been tested for COVID? Had a positive COVID test? No °4. Has the patient been in contact with anyone who has tested positive? No ° °

## 2018-06-10 ENCOUNTER — Ambulatory Visit (INDEPENDENT_AMBULATORY_CARE_PROVIDER_SITE_OTHER): Payer: BLUE CROSS/BLUE SHIELD | Admitting: Podiatry

## 2018-06-10 ENCOUNTER — Encounter: Payer: Self-pay | Admitting: Plastic Surgery

## 2018-06-10 ENCOUNTER — Other Ambulatory Visit: Payer: Self-pay

## 2018-06-10 ENCOUNTER — Ambulatory Visit (INDEPENDENT_AMBULATORY_CARE_PROVIDER_SITE_OTHER): Payer: Self-pay | Admitting: Plastic Surgery

## 2018-06-10 ENCOUNTER — Ambulatory Visit (INDEPENDENT_AMBULATORY_CARE_PROVIDER_SITE_OTHER): Payer: BLUE CROSS/BLUE SHIELD

## 2018-06-10 VITALS — BP 112/77 | HR 78 | Temp 98.6°F | Ht 64.0 in | Wt 147.0 lb

## 2018-06-10 VITALS — Temp 97.3°F

## 2018-06-10 DIAGNOSIS — Z09 Encounter for follow-up examination after completed treatment for conditions other than malignant neoplasm: Secondary | ICD-10-CM

## 2018-06-10 DIAGNOSIS — M21621 Bunionette of right foot: Secondary | ICD-10-CM

## 2018-06-10 DIAGNOSIS — M21619 Bunion of unspecified foot: Secondary | ICD-10-CM | POA: Diagnosis not present

## 2018-06-10 DIAGNOSIS — Z719 Counseling, unspecified: Secondary | ICD-10-CM

## 2018-06-10 NOTE — Progress Notes (Signed)
Preoperative Dx: hyperpigmentation  Postoperative Dx:  same  Procedure: laser to hyperpigmentation of legs   Anesthesia: none  Description of Procedure:  Risks and complications were explained to the patient. Consent was confirmed and signed. Time out was called and all information was confirmed to be correct. The area  area was prepped with alcohol and wiped dry. The IPL laser was set at 7.4 J/cm2. The anterior legs were lasered. The patient tolerated the procedure well and there were no complications. The patient is to follow up in 4 weeks.   Botulinum Toxin Procedure Note  Procedure: Cosmetic botulinum toxin  Pre-operative Diagnosis: Dynamic rhytides   Post-operative Diagnosis: Same  Complications:  None  Brief history: The patient desires botulinum toxin injection of her forehead. I discussed with the patient this proposed procedure of botulinum toxin injections, which is customized depending on the particular needs of the patient. It is performed on facial rhytids as a temporary correction. The alternatives were discussed with the patient. The risks were addressed including bleeding, scarring, infection, damage to deeper structures, asymmetry, and chronic pain, which may occur infrequently after a procedure. The individual's choice to undergo a surgical procedure is based on the comparison of risks to potential benefits. Other risks include unsatisfactory results, brow ptosis, eyelid ptosis, allergic reaction, temporary paralysis, which should go away with time, bruising, blurring disturbances and delayed healing. Botulinum toxin injections do not arrest the aging process or produce permanent tightening of the eyelid.  Operative intervention maybe necessary to maintain the results of a blepharoplasty or botulinum toxin. The patient understands and wishes to proceed. An informed consent was signed and informational brochures given to her prior to the procedure.  Procedure: The area was  prepped with alcohol and dried with a clean gauze. Using a clean technique, the botulinum toxin was diluted with 1.25 cc of preservative-free normal saline which was slowly injected with an 18 gauge needle in a tuberculin syringes.  A 32 gauge needles were then used to inject the botulinum toxin. This mixture allow for an aliquot of 5 units per 0.1 cc in each injection site.    Subsequently the mixture was injected in the glabellar and forehead area with preservation of the temporal branch to the lateral eyebrow as well as into each lateral canthal area beginning from the lateral orbital rim medial to the zygomaticus major in 3 separate areas. A total of 15 Units of botulinum toxin was used. The forehead and glabellar area was injected with care to inject intramuscular only while holding pressure on the supratrochlear vessels in each area during each injection on either side of the medial corrugators. The injection proceeded vertically superiorly to the medial 2/3 of the frontalis muscle and superior 2/3 of the lateral frontalis, again with preservation of the frontal branch.  No complications were noted. Light pressure was held for 5 minutes. She was instructed explicitly in post-operative care.  Botox LOT:  [id number] C2 EXP:  5/22

## 2018-06-10 NOTE — Patient Instructions (Signed)
Mederma Scar Cream

## 2018-06-10 NOTE — Progress Notes (Signed)
   Subjective:  Patient presents today status post bunionectomy and Tailor's bunionectomy right. DOS: 04/02/2018.  Patient states that she has been doing a lot of exercise recently and she does have some swelling to the surgical foot.  Patient denies any significant pain.  No past medical history on file.    Objective/Physical Exam Neurovascular status intact.  Skin incisions appear to be well coapted. No sign of infectious process noted. No dehiscence.  There is a moderate edema around the surgical foot.  Negative for any pain on palpation.  Range of motion to the first MPJ is doing well.  Radiographic exam Osteotomies demonstrate routine healing with orthopedic hardware intact.  The proximal screw the first metatarsal does appear to be slightly backing out.  We will simply observe for the moment there is also some callus formation around the osteotomy sites consistent with patient's history of increased activity which likely created some movement to the osteotomy sites and callus formation.  Assessment: 1. s/p bunionectomy and Tailor's bunionectomy right. DOS: 04/02/2018   Plan of Care:  1. Patient was evaluated. 2.  Continue wearing new balance walking shoes 3.  Explained to the patient that she does need to reduce her activity somewhat.  Begin with just simple walking.  Discontinue aerobic/high impact activities for an additional 3 weeks to allow for healing. 4.  Recommend Mederma scar cream OTC 5.  I did explain to the patient that if the screws continues to back out we will remove the screw here in the office. 6.  Return to clinic in 8 weeks for final follow-up x-rays   Edrick Kins, DPM [facility]  Dr. Edrick Kins, [address]                                        [place], [place] [address]                Office [phone]  Fax [phone]

## 2018-06-15 ENCOUNTER — Telehealth: Payer: Self-pay | Admitting: Plastic Surgery

## 2018-06-15 NOTE — Telephone Encounter (Signed)
Patient left voicemail. Patient is asking about numbing cream. Patient wants to know if there can be some numbing cream called in for next procedure on her face scheduled for 06/30/18.

## 2018-06-16 NOTE — Telephone Encounter (Signed)
RX for Emla cream sent in to pharmacy by Dr. Rita Ohara

## 2018-06-23 MED ORDER — LIDOCAINE-PRILOCAINE 2.5-2.5 % EX CREA: 1.0000 "application " | TOPICAL_CREAM | CUTANEOUS | 0 refills | Status: DC | PRN

## 2018-06-23 NOTE — Addendum Note (Signed)
Addended by: Wallace Going on: 06/23/2018 03:31 PM   Modules accepted: Orders

## 2018-06-30 ENCOUNTER — Ambulatory Visit: Payer: BLUE CROSS/BLUE SHIELD | Admitting: Plastic Surgery

## 2018-07-07 ENCOUNTER — Encounter (HOSPITAL_BASED_OUTPATIENT_CLINIC_OR_DEPARTMENT_OTHER): Admission: RE | Payer: Self-pay | Source: Home / Self Care

## 2018-07-07 ENCOUNTER — Ambulatory Visit (HOSPITAL_BASED_OUTPATIENT_CLINIC_OR_DEPARTMENT_OTHER)
Admission: RE | Admit: 2018-07-07 | Payer: BLUE CROSS/BLUE SHIELD | Source: Home / Self Care | Admitting: Plastic Surgery

## 2018-07-07 SURGERY — EXCISION LIPOMA
Anesthesia: Choice | Site: Arm Upper | Laterality: Right

## 2018-07-09 ENCOUNTER — Telehealth: Payer: Self-pay | Admitting: Plastic Surgery

## 2018-07-09 NOTE — Telephone Encounter (Signed)
Called patient to confirm appointment scheduled for tomorrow. Patient answered the following questions: °1.Has the patient traveled outside of the state of [place] at all within the past 6 weeks? No °2.Does the patient have a fever or cough at all? No °3.Has the patient been tested for COVID? Had a positive COVID test? No °4. Has the patient been in contact with anyone who has tested positive? No ° °

## 2018-07-10 ENCOUNTER — Other Ambulatory Visit: Payer: Self-pay

## 2018-07-10 ENCOUNTER — Ambulatory Visit (INDEPENDENT_AMBULATORY_CARE_PROVIDER_SITE_OTHER): Payer: Self-pay | Admitting: Plastic Surgery

## 2018-07-10 ENCOUNTER — Encounter: Payer: Self-pay | Admitting: Plastic Surgery

## 2018-07-10 VITALS — BP 146/70 | HR 77 | Temp 98.4°F

## 2018-07-10 DIAGNOSIS — D1721 Benign lipomatous neoplasm of skin and subcutaneous tissue of right arm: Secondary | ICD-10-CM

## 2018-07-10 DIAGNOSIS — Z719 Counseling, unspecified: Secondary | ICD-10-CM

## 2018-07-10 NOTE — Progress Notes (Signed)
Preoperative Dx: hyperpigmentation and redness  Postoperative Dx:  same  Procedure: laser to face   Anesthesia: none  Description of Procedure:  Risks and complications were explained to the patient. Consent was confirmed and signed. Time out was called and all information was confirmed to be correct. The area  area was prepped with alcohol and wiped dry. The IPL laser was set at 7.4 J/cm2. The face was lasered. The patient tolerated the procedure well and there were no complications. The patient is to follow up in 4 weeks.

## 2018-07-14 ENCOUNTER — Encounter: Payer: BLUE CROSS/BLUE SHIELD | Admitting: Plastic Surgery

## 2018-08-03 ENCOUNTER — Ambulatory Visit (INDEPENDENT_AMBULATORY_CARE_PROVIDER_SITE_OTHER): Payer: BC Managed Care – PPO

## 2018-08-03 ENCOUNTER — Ambulatory Visit: Payer: BC Managed Care – PPO | Admitting: Podiatry

## 2018-08-03 ENCOUNTER — Other Ambulatory Visit: Payer: Self-pay

## 2018-08-03 VITALS — Temp 97.5°F

## 2018-08-03 DIAGNOSIS — M76822 Posterior tibial tendinitis, left leg: Secondary | ICD-10-CM

## 2018-08-03 DIAGNOSIS — M7752 Other enthesopathy of left foot: Secondary | ICD-10-CM

## 2018-08-03 DIAGNOSIS — M21621 Bunionette of right foot: Secondary | ICD-10-CM | POA: Diagnosis not present

## 2018-08-03 MED ORDER — METHYLPREDNISOLONE 4 MG PO TBPK: ORAL_TABLET | ORAL | 0 refills | Status: DC

## 2018-08-03 MED ORDER — MELOXICAM 15 MG PO TABS: 15.0000 mg | ORAL_TABLET | Freq: Every day | ORAL | 1 refills | Status: DC

## 2018-08-06 NOTE — Progress Notes (Signed)
    HPI: 63 year old female presenting today status post bunionectomy and Tailor's bunionectomy right. DOS: 04/02/2018. She states she is doing well regarding the surgery on the right foot. She denies any significant pain or modifying factors.  She states she fell last week and has had pain in the left foot since. She reports associated swelling. Bearing weight and ambulation increases the pain. She has not done anything for treatment. Patient is here for further evaluation and treatment.   No past medical history on file.     Physical Exam: General: The patient is alert and oriented x3 in no acute distress.  Dermatology: Skin is warm, dry and supple bilateral lower extremities. Negative for open lesions or macerations.  Vascular: Palpable pedal pulses bilaterally. No edema or erythema noted. Capillary refill within normal limits.  Neurological: Epicritic and protective threshold grossly intact bilaterally.   Musculoskeletal Exam: Pain on palpation noted to the posterior tibial tendon of the left foot. Range of motion within normal limits. Muscle strength 5/5 in all muscle groups bilateral lower extremities.  Radiographic Exam:  Normal osseous mineralization. Joint spaces preserved. No fracture or dislocation identified.    Assessment: 1. Posterior tibial tendinitis left 2. s/p bunionectomy and Tailor's bunionectomy right. DOS: 04/02/2018   Plan of Care:  1. Patient was evaluated. Radiographs were reviewed today. 2. Injection of 0.5 mL Celestone Soluspan injected into the posterior tibial tendon sheath.  3. Prescription for Medrol Dose Pak provided to patient. 4. Prescription for Meloxicam provided to patient. 5. Recommended good shoe gear.  6. Compression anklet dispensed.  7. Return to clinic as needed.    Edrick Kins, DPM [facility]  Dr. Edrick Kins, [address]                                        [place], [place] [address]                 Office [phone]  Fax [phone]

## 2018-08-10 ENCOUNTER — Telehealth: Payer: Self-pay | Admitting: *Deleted

## 2018-08-10 NOTE — Telephone Encounter (Signed)
I called pt and she states it isn't the surgery foot and it hurt on the top of the right foot so she called and made an appt.

## 2018-08-10 NOTE — Telephone Encounter (Signed)
Pt called states she is having pain on the top of the right foot.

## 2018-08-12 ENCOUNTER — Other Ambulatory Visit: Payer: Self-pay

## 2018-08-12 ENCOUNTER — Ambulatory Visit: Payer: BC Managed Care – PPO | Admitting: Podiatry

## 2018-08-12 ENCOUNTER — Encounter: Payer: Self-pay | Admitting: Podiatry

## 2018-08-12 VITALS — Temp 97.9°F

## 2018-08-12 DIAGNOSIS — M779 Enthesopathy, unspecified: Secondary | ICD-10-CM

## 2018-08-12 DIAGNOSIS — M76822 Posterior tibial tendinitis, left leg: Secondary | ICD-10-CM

## 2018-08-12 DIAGNOSIS — M778 Other enthesopathies, not elsewhere classified: Secondary | ICD-10-CM

## 2018-08-13 ENCOUNTER — Telehealth: Payer: Self-pay | Admitting: *Deleted

## 2018-08-13 NOTE — Telephone Encounter (Signed)
Pt states she was seen in office yesterday and received an injection in the center top of her right foot, it did not help a lot and she would like to know what else she can do. Pt states she did get the inserts and is wearing them.

## 2018-08-13 NOTE — Telephone Encounter (Signed)
Left message for pt to call to discuss the pain in the right foot.

## 2018-08-13 NOTE — Telephone Encounter (Signed)
I called pt. Pt states she is not sure what is wrong with the right foot, that she got a shot on top, it doesn't feel any better and the left foot better sooner. I told pt the right is capsulitis and is the fibrous cover and does not have the same blood nourishing the area for healing so it may take longer for the steroid to begin to take care of the inflammation, it a different structure than the tendonitis. I told pt she needed to rest, ice and wear a stiffer bottom shoe to decrease movement and irritation of the site. Pt states she would like Dr. Amalia Hailey to give her information on her right foot.

## 2018-08-15 NOTE — Progress Notes (Signed)
    HPI: 63 year old female presenting today status post bunionectomy and Tailor's bunionectomy right. DOS: 04/02/2018. She states her left foot is improving. She reports continued pain to the dorsal aspect of the right foot. Walking increases the pain. She has been taking Meloxicam and wearing good sneakers. There are no modifying factors noted. Patient is here for further evaluation and treatment.   History reviewed. No pertinent past medical history.    Physical Exam: General: The patient is alert and oriented x3 in no acute distress.  Dermatology: Skin is warm, dry and supple bilateral lower extremities. Negative for open lesions or macerations.  Vascular: Palpable pedal pulses bilaterally. No edema or erythema noted. Capillary refill within normal limits.  Neurological: Epicritic and protective threshold grossly intact bilaterally.   Musculoskeletal Exam: Pain on palpation noted to the posterior tibial tendon of the left foot and to the right midfoot. Range of motion within normal limits. Muscle strength 5/5 in all muscle groups bilateral lower extremities.  Assessment: 1. Posterior tibial tendinitis left 2. s/p bunionectomy and Tailor's bunionectomy right. DOS: 04/02/2018 3. Midfoot capsulitis right    Plan of Care:  1. Patient was evaluated.  2. Injection of 0.5 mL Celestone Soluspan injected into the right midfoot.  3. Continue taking Meloxicam.  4. Continue wearing New Balance shoes.  5. Recommended OTC insoles from [employer].  6. Return to clinic in 4 weeks.     Edrick Kins, DPM [facility]  Dr. Edrick Kins, [address]                                        [place], [place] [address]                Office [phone]  Fax [phone]

## 2018-09-07 ENCOUNTER — Ambulatory Visit: Payer: BC Managed Care – PPO | Admitting: Podiatry

## 2018-12-04 ENCOUNTER — Ambulatory Visit
Admission: RE | Admit: 2018-12-04 | Discharge: 2018-12-04 | Disposition: A | Discharge status: Home / Self Care | Payer: BLUE CROSS/BLUE SHIELD | Source: Ambulatory Visit | Attending: Obstetrics and Gynecology | Admitting: Obstetrics and Gynecology

## 2018-12-04 ENCOUNTER — Other Ambulatory Visit: Payer: Self-pay | Admitting: Obstetrics and Gynecology

## 2018-12-04 DIAGNOSIS — G935 Compression of brain: Secondary | ICD-10-CM

## 2018-12-04 DIAGNOSIS — K4090 Unilateral inguinal hernia, without obstruction or gangrene, not specified as recurrent: Secondary | ICD-10-CM

## 2018-12-04 DIAGNOSIS — R1909 Other intra-abdominal and pelvic swelling, mass and lump: Secondary | ICD-10-CM

## 2019-08-05 ENCOUNTER — Other Ambulatory Visit: Payer: Self-pay

## 2019-08-05 ENCOUNTER — Encounter: Payer: Self-pay | Admitting: Podiatry

## 2019-08-05 ENCOUNTER — Ambulatory Visit (INDEPENDENT_AMBULATORY_CARE_PROVIDER_SITE_OTHER): Payer: BC Managed Care – PPO

## 2019-08-05 ENCOUNTER — Ambulatory Visit: Payer: BC Managed Care – PPO | Admitting: Podiatry

## 2019-08-05 ENCOUNTER — Telehealth: Payer: Self-pay | Admitting: *Deleted

## 2019-08-05 DIAGNOSIS — S99912A Unspecified injury of left ankle, initial encounter: Secondary | ICD-10-CM | POA: Diagnosis not present

## 2019-08-05 DIAGNOSIS — IMO0001 Reserved for inherently not codable concepts without codable children: Secondary | ICD-10-CM

## 2019-08-05 DIAGNOSIS — S93402A Sprain of unspecified ligament of left ankle, initial encounter: Secondary | ICD-10-CM

## 2019-08-05 DIAGNOSIS — M7752 Other enthesopathy of left foot: Secondary | ICD-10-CM

## 2019-08-05 NOTE — Telephone Encounter (Signed)
I called pt and she states she called and got an appt. I apologized for not getting to her sooner and she stated understanding.

## 2019-08-05 NOTE — Telephone Encounter (Signed)
Pt called states she fell down the steps holding a box.

## 2019-08-05 NOTE — Progress Notes (Signed)
Subjective: 64 year old female presents the office today for an acute appointment given left ankle pain.  She states that she was walking at her back door with a pop when she missed a step.  She had immediate pain to the area.  She did take ibuprofen but she states it is not really hurting she just does not feel secure.  She is noted swelling and bruising to the lateral aspect ankle. Denies any systemic complaints such as fevers, chills, nausea, vomiting. No acute changes since last appointment, and no other complaints at this time.   Objective: AAO x3, NAD DP/PT pulses palpable bilaterally, CRT less than 3 seconds Tenderness along the distal aspect of the fibula and along the course of the lateral ankle complex.  No gross instability is present.  No pain at the malleolus or proximal tib-fib.  No pain at the metatarsal base, talus or other areas of the foot.  There is moderate edema to the medial ankle.  Mild bruising.   No open lesions or pre-ulcerative lesions.  No pain with calf compression, swelling, warmth, erythema  Assessment: 64 year old female left ankle injury  Plan: -All treatment options discussed with the patient including all alternatives, risks, complications.  -X-rays obtained reviewed.  No definitive evidence of acute fracture. -Recommend immobilization in cam boot.  Compression anklet.  Ice elevation.  Anti-inflammatories as needed. -Patient encouraged to call the office with any questions, concerns, change in symptoms.   Return in about 2 weeks (around 08/19/2019).  Trula Slade DPM

## 2019-08-06 ENCOUNTER — Ambulatory Visit: Payer: BC Managed Care – PPO | Admitting: Podiatry

## 2019-08-12 ENCOUNTER — Telehealth: Payer: Self-pay | Admitting: Podiatry

## 2019-08-12 NOTE — Telephone Encounter (Signed)
Called lvm to schedule appt

## 2019-08-12 NOTE — Telephone Encounter (Signed)
Left message instructing pt to continue to rest, ice and elevate, continue the cam boot and compression sock and call to check schedule for cancellation appts.

## 2019-08-12 NOTE — Telephone Encounter (Signed)
Pt called and lvm stating that she knows that her ankle was looked at on 08/05/19 and its a sprain but shes having pain and wanted to know if she needed to get in sooner please advise next appt 08/25/19

## 2019-08-25 ENCOUNTER — Ambulatory Visit: Payer: BC Managed Care – PPO | Admitting: Podiatry

## 2019-08-25 ENCOUNTER — Other Ambulatory Visit: Payer: Self-pay

## 2019-08-25 DIAGNOSIS — S93402D Sprain of unspecified ligament of left ankle, subsequent encounter: Secondary | ICD-10-CM

## 2019-08-25 DIAGNOSIS — M659 Synovitis and tenosynovitis, unspecified: Secondary | ICD-10-CM | POA: Diagnosis not present

## 2019-08-25 DIAGNOSIS — M25472 Effusion, left ankle: Secondary | ICD-10-CM

## 2019-08-25 DIAGNOSIS — M65972 Unspecified synovitis and tenosynovitis, left ankle and foot: Secondary | ICD-10-CM

## 2019-08-25 MED ORDER — MELOXICAM 15 MG PO TABS
15.0000 mg | ORAL_TABLET | Freq: Every day | ORAL | 1 refills | Status: DC
Start: 2019-08-25 — End: 2020-06-26

## 2019-08-25 NOTE — Progress Notes (Signed)
° °  HPI: 64 y.o. female presenting today for follow-up treatment regarding an ankle sprain to the left ankle that occurred approximately 2-3 weeks ago.  Patient states she had a trip and fall injury and sprained her ankle on 08/05/2019.  She was in urgent work in referral to our office and she was dispensed a cam boot and recommended conservative rest with ice and elevation.  She has been wearing the cam boot for the past 2 weeks.  She states that the pain has improved significantly.  She presents for further treatment evaluation  No past medical history on file.   Physical Exam: General: The patient is alert and oriented x3 in no acute distress.  Dermatology: Skin is warm, dry and supple bilateral lower extremities. Negative for open lesions or macerations.  Vascular: Palpable pedal pulses bilaterally.  There continues to be some moderate edema to the left ankle diffusely. Capillary refill within normal limits.  Neurological: Epicritic and protective threshold grossly intact bilaterally.   Musculoskeletal Exam: Range of motion within normal limits to all pedal and ankle joints bilateral. Muscle strength 5/5 in all groups bilateral.  There continues to be some tenderness to palpation overlying the anterior lateral aspect of the ankle joint left.  Assessment: 1.  Moderate ankle sprain left, subsequent encounter 2.  Synovitis of left ankle 3.  Edema left ankle   Plan of Care:  1. Patient evaluated.  2.  Injection of 0.5 cc Celestone Soluspan injection of the lateral aspect of the left ankle joint 3.  Compression ankle sleeve dispensed 4.  Prescription for meloxicam 15 mg daily 5.  Patient may begin to transition out of the cam boot.  Recommend good supportive sneakers 6.  Return to clinic as needed      Edrick Kins, DPM [facility]  Dr. Edrick Kins, DPM    [address] [place], [place] [address]                Office [phone]  Fax [phone]

## 2020-03-15 ENCOUNTER — Other Ambulatory Visit: Payer: Self-pay | Admitting: Endocrinology

## 2020-03-15 ENCOUNTER — Ambulatory Visit
Admission: RE | Admit: 2020-03-15 | Discharge: 2020-03-15 | Disposition: A | Discharge status: Home / Self Care | Payer: Medicare Other | Source: Ambulatory Visit | Attending: Endocrinology | Admitting: Endocrinology

## 2020-03-15 DIAGNOSIS — E041 Nontoxic single thyroid nodule: Secondary | ICD-10-CM

## 2020-03-16 ENCOUNTER — Other Ambulatory Visit: Payer: Self-pay | Admitting: Endocrinology

## 2020-03-16 DIAGNOSIS — E079 Disorder of thyroid, unspecified: Secondary | ICD-10-CM

## 2020-06-26 ENCOUNTER — Other Ambulatory Visit: Payer: Self-pay

## 2020-06-26 MED ORDER — MELOXICAM 15 MG PO TABS: 15.0000 mg | ORAL_TABLET | Freq: Every day | ORAL | 1 refills | Status: DC

## 2021-04-17 DIAGNOSIS — Z0000 Encounter for general adult medical examination without abnormal findings: Secondary | ICD-10-CM | POA: Diagnosis not present

## 2021-06-12 IMAGING — US US THYROID
1 series · 14 of 25 positions shown · non-contrast
Comparison: None.

CLINICAL DATA: Hoarseness

EXAM:
THYROID ULTRASOUND
TECHNIQUE: Ultrasound examination of the thyroid gland and adjacent soft
tissues was performed.

[Series 1: us thyroid · 0.08mm/px · 14 of 42 slices shown]
[im 1/42]
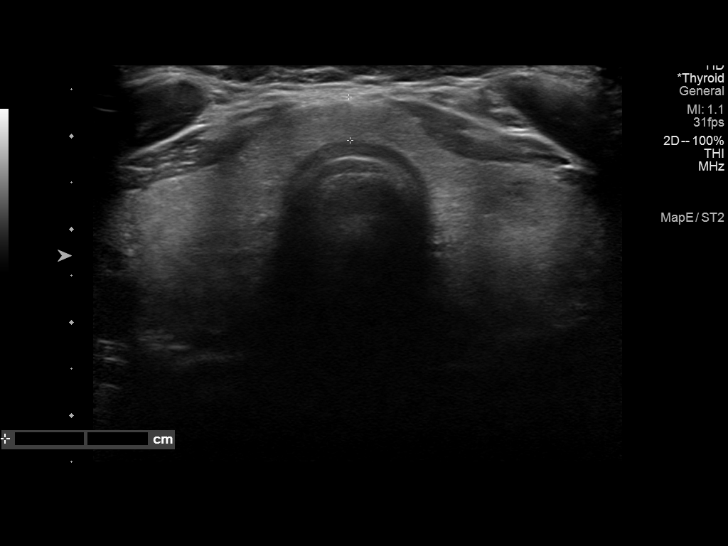
[im 4/42]
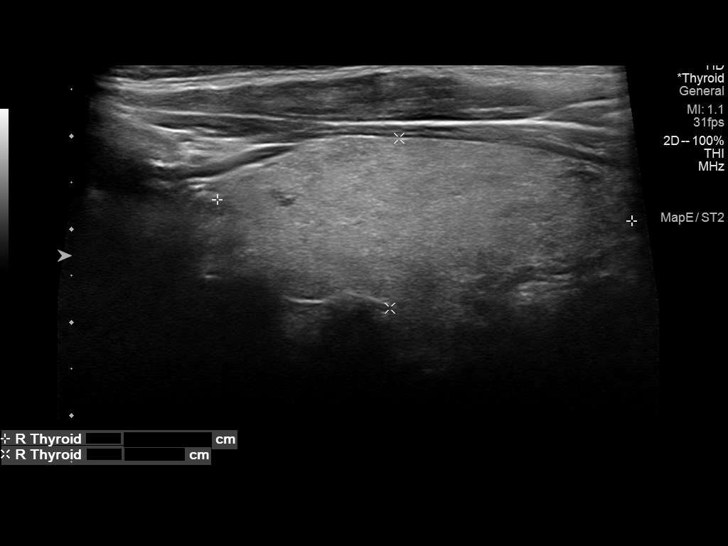
[im 7/42]
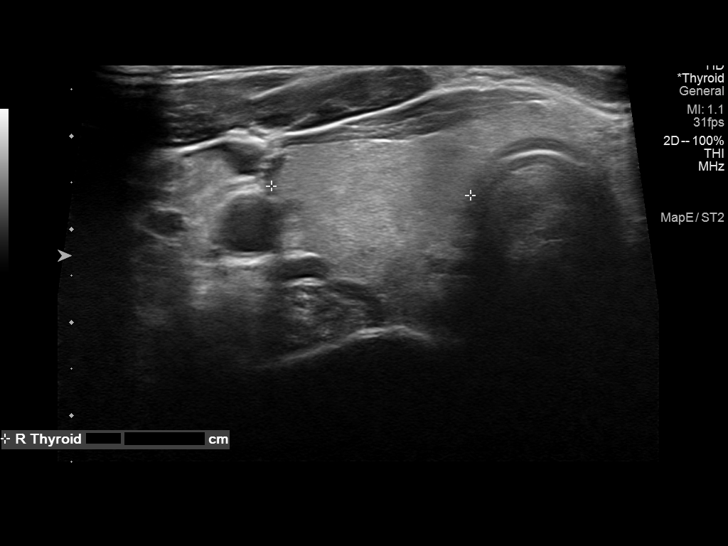
[im 11/42]
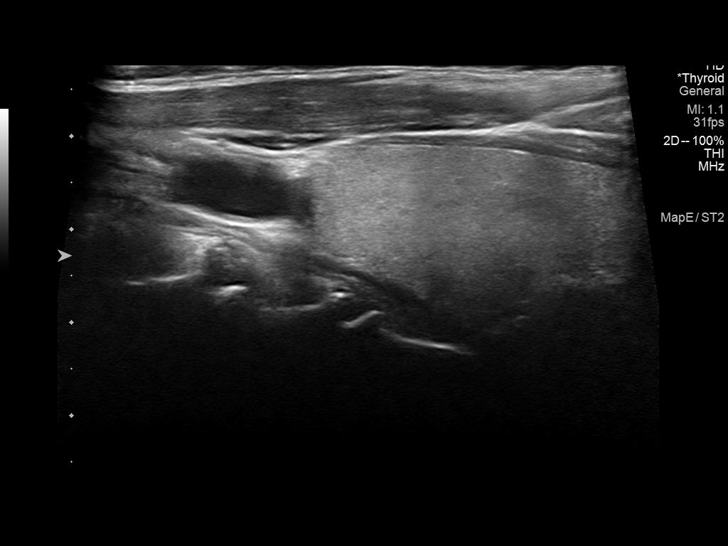
[im 14/42]
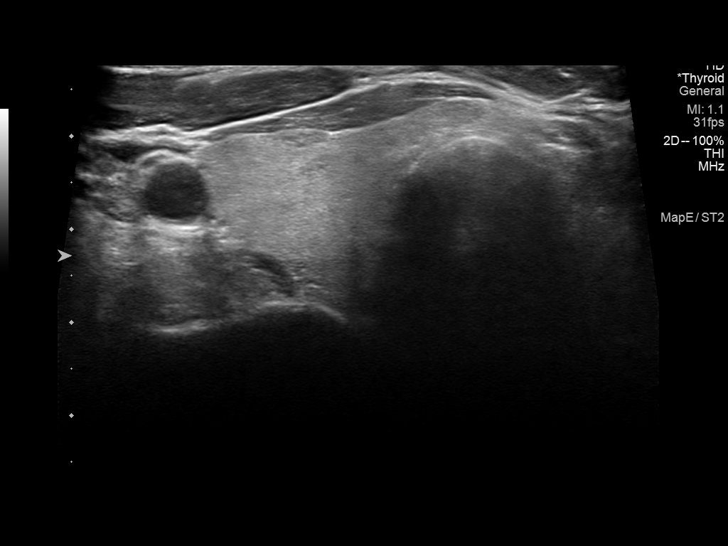
[im 16/42]
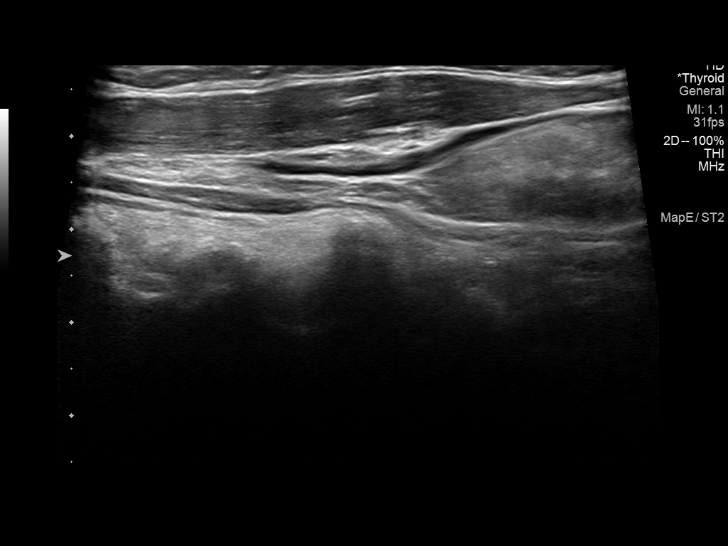
[im 19/42]
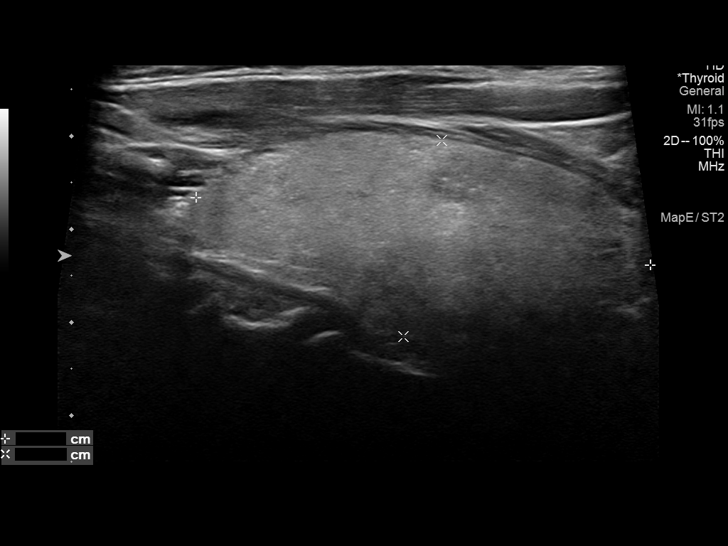
[im 23/42]
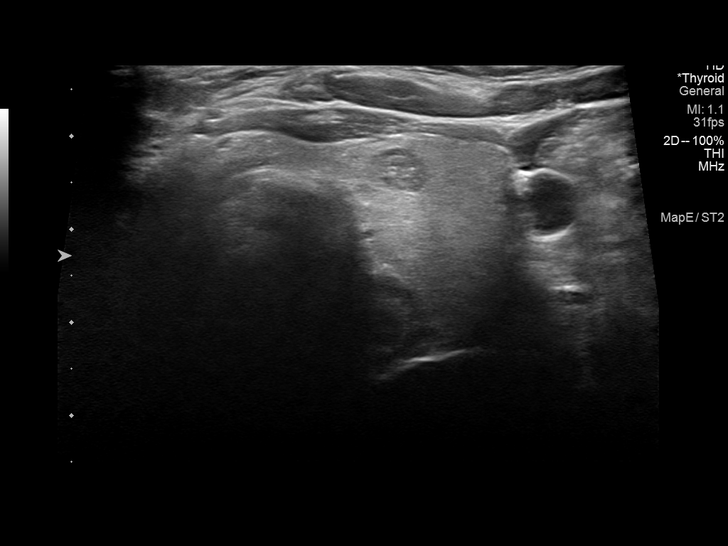
[im 26/42]
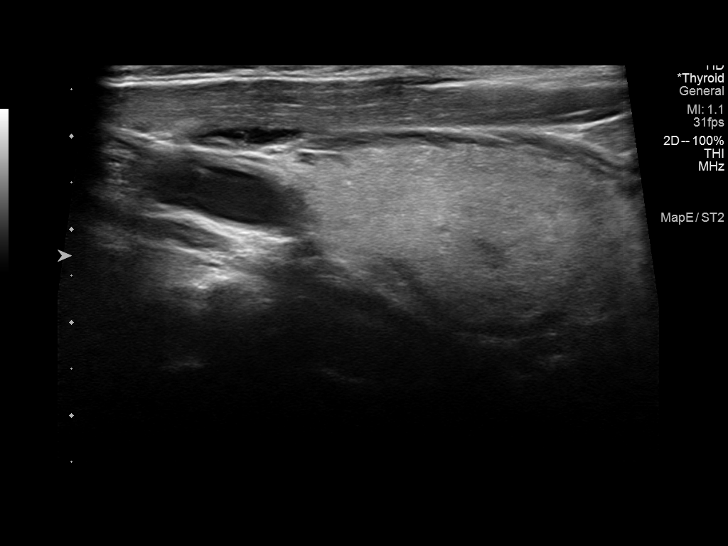
[im 28/42]
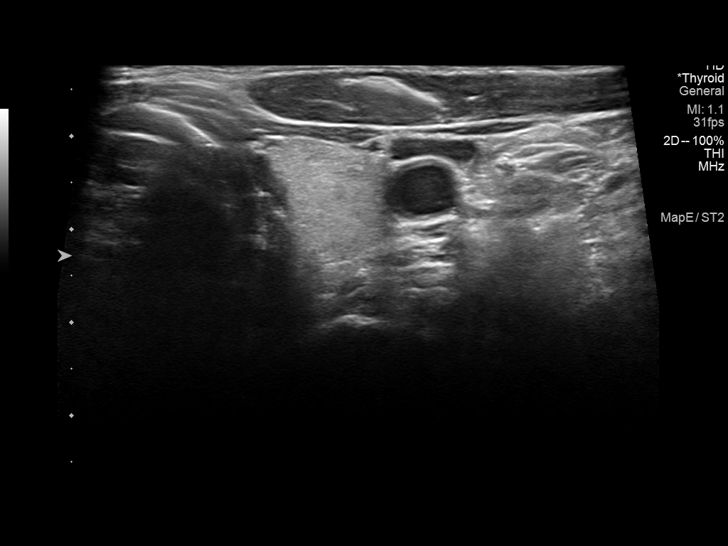
[im 31/42]
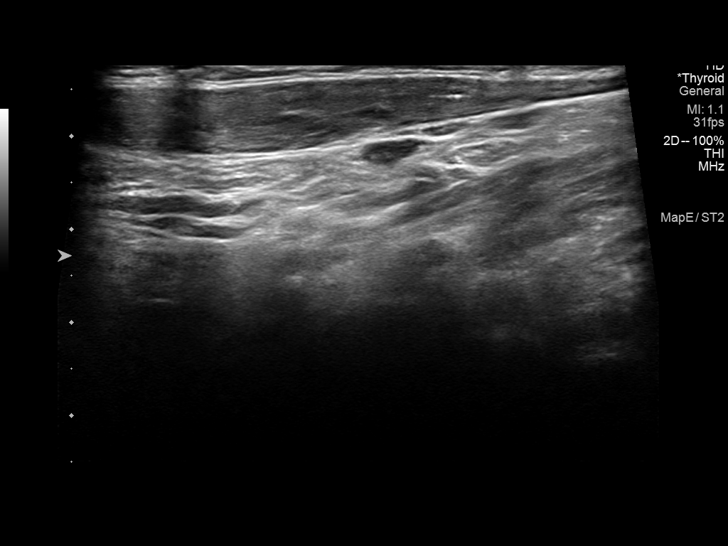
[im 35/42]
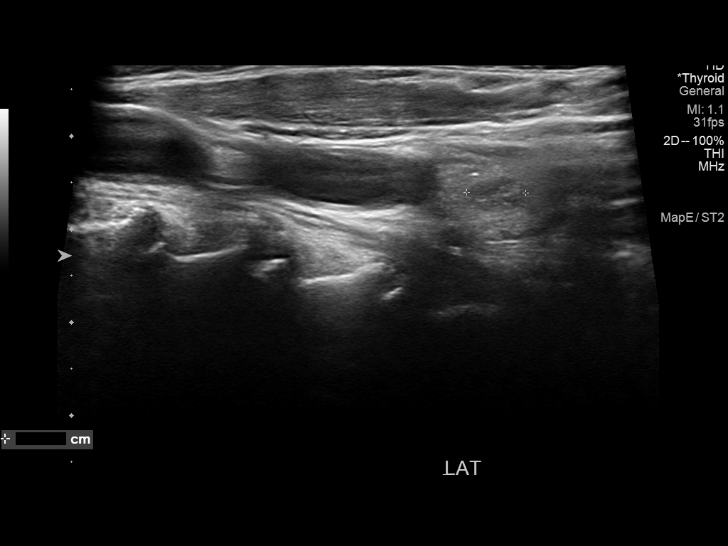
[im 38/42]
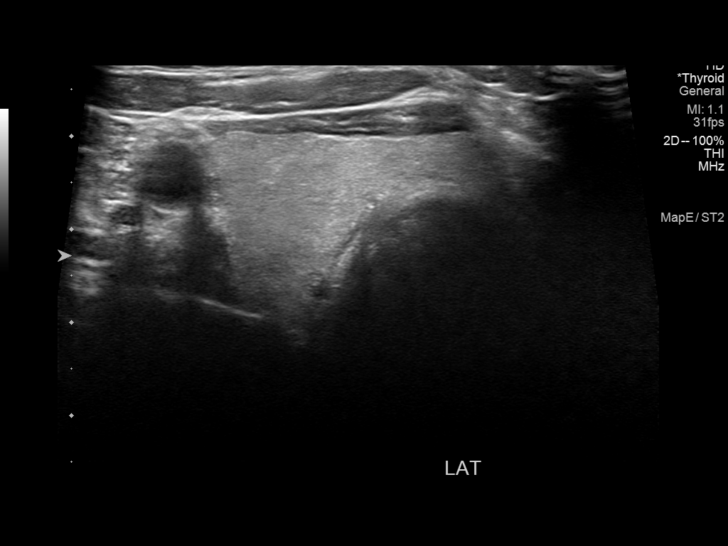
[im 42/42]
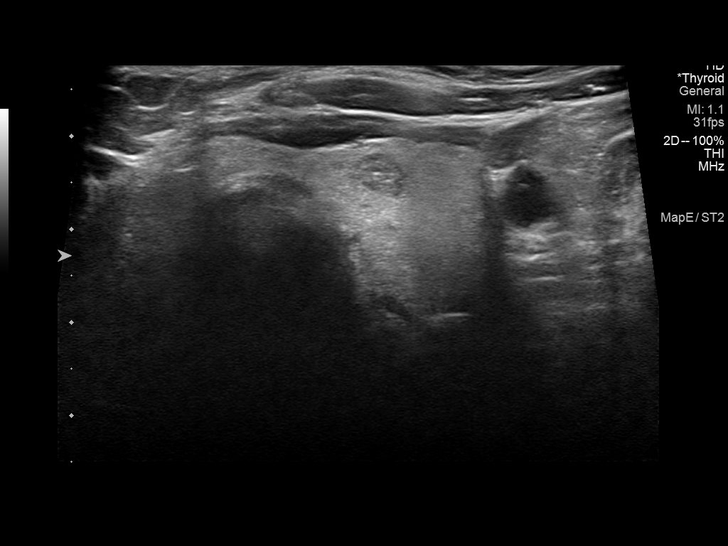

[14 of 25 positions shown; findings below may reference images not displayed]

FINDINGS: Parenchymal Echotexture: Mildly heterogenous

Isthmus: 0.5 cm thickness

Right lobe: 4.5 x 1.8 x 2.1 cm

Left lobe: 4.9 x 2.1 x 1.8 cm

_________________________________________________________

Estimated total number of nodules >/= 1 cm: 0

Number of spongiform nodules >/=  2 cm not described below (TR1): 0

Number of mixed cystic and solid nodules >/= 1.5 cm not described
below (TR2): 0

_________________________________________________________

Nodule # 1: 0.6 cm hypoechoic nodule without calcifications, mid
right; This nodule does NOT meet TI-RADS criteria for biopsy or
dedicated follow-up.

Nodule # 2: 0.7 cm hypoechoic nodule without calcifications, mid
left; This nodule does NOT meet TI-RADS criteria for biopsy or
dedicated follow-up.
IMPRESSION: 1. Borderline thyromegaly with subcentimeter nodules which do not
meet criteria for biopsy or follow-up.

The above is in keeping with the ACR TI-RADS recommendations - [HOSPITAL] 0354;[DATE].

## 2021-07-10 ENCOUNTER — Ambulatory Visit (INDEPENDENT_AMBULATORY_CARE_PROVIDER_SITE_OTHER): Payer: Medicare Other | Admitting: Pulmonary Disease

## 2021-07-10 ENCOUNTER — Encounter: Payer: Self-pay | Admitting: Pulmonary Disease

## 2021-07-10 VITALS — BP 116/72 | HR 71 | Ht 64.0 in | Wt 145.6 lb

## 2021-07-10 DIAGNOSIS — R053 Chronic cough: Secondary | ICD-10-CM

## 2021-07-10 DIAGNOSIS — K219 Gastro-esophageal reflux disease without esophagitis: Secondary | ICD-10-CM | POA: Diagnosis not present

## 2021-07-10 MED ORDER — PANTOPRAZOLE SODIUM 40 MG PO TBEC: 40.0000 mg | DELAYED_RELEASE_TABLET | Freq: Every day | ORAL | 3 refills | Status: DC

## 2021-07-10 NOTE — Patient Instructions (Addendum)
Take pantoprazole '40mg'$  daily 30 minutes before breakfast ? ?Eat dinner at least 2 hours before bedtime.  ? ?Avoid alcohol in the evenings for 2-4 weeks ? ?Recommend sleeping with your head elevated to avoid nocturnal reflux disease. You can use a wedge pillow or elevated the bedfram 4-6 inches with blocks. ? ?Follow up in 4 weeks for video visit. ?

## 2021-07-10 NOTE — Progress Notes (Signed)
Synopsis: Referred in May 2023 for chronic cough by Reynold Bowen, MD  Subjective:   PATIENT ID: Vanessa Harris GENDER: female DOB: 1955/03/13, MRN: [id number]   HPI  Chief Complaint  Patient presents with   Consult    Referred by PCP for chronic cough since December 2022. Described as a dry cough but she does feel like she has some chest congestion.    Vanessa Harris is a 66 year old female, never smoker who is referred to pulmonary clinic for chronic cough.   She reports having cough since January. The cough is dry. She reports irritation in her throat and does not feel the cough is coming from her chest. She denies sinus congestion or post nasal drainage. She denies wheezing or shortness of breath with the cough. She also reports hoarseness in her voice. She denies dysphagia or coughing with eating or drinking. She reports the cough can happen at random times during the day and can cause night time awakenings most nights. She denies overt heart burn symptoms. She was trialed on pantoprazole by her PCP without improvement.   Chest radiograph from 05/29/21 is unremarkable. Patient brought in copy of radiograph on CD for review.   She is a never smoker. She works in [employer] research (medical). She drinks 1-2 vodka drinks per night. She reports eating dinner after 8pm and usually goes to bed around 10pm.   Past Medical History:  Diagnosis Date   Hyperlipidemia      Family History  Problem Relation Age of Onset   Non-Hodgkin's lymphoma Mother    Myelodysplastic syndrome Mother    Diabetes Father    Congestive Heart Failure Father      Social History   Socioeconomic History   Marital status: Married    Spouse name: Not on file   Number of children: Not on file   Years of education: Not on file   Highest education level: Not on file  Occupational History   Not on file  Tobacco Use   Smoking status: Never   Smokeless tobacco: Never  Substance and Sexual Activity   Alcohol use: Yes    Drug use: Not on file   Sexual activity: Not on file  Other Topics Concern   Not on file  Social History Narrative   Not on file   Social Determinants of Health   Financial Resource Strain: Not on file  Food Insecurity: Not on file  Transportation Needs: Not on file  Physical Activity: Not on file  Stress: Not on file  Social Connections: Not on file  Intimate Partner Violence: Not on file     No Known Allergies   Outpatient Medications Prior to Visit  Medication Sig Dispense Refill   buPROPion (WELLBUTRIN SR) 150 MG 12 hr tablet Take 150 mg by mouth 2 (two) times daily.     estradiol (VIVELLE-DOT) 0.05 MG/24HR patch estradiol 0.05 mg/24 hr semiweekly transdermal patch  Apply 1 patch twice a week by transdermal route for 84 days.     progesterone (PROMETRIUM) 100 MG capsule Take 100 mg by mouth daily.     spironolactone (ALDACTONE) 50 MG tablet Take 50 mg by mouth daily.     buPROPion (WELLBUTRIN SR) 150 MG 12 hr tablet TK 1 T PO BID  11   estradiol (VIVELLE-DOT) 0.05 MG/24HR patch   7   estradiol (VIVELLE-DOT) 0.075 MG/24HR APP 1 PA EXT TO THE SKIN 2 TIMES A WK     FABIOR 0.1 % FOAM APPLY TO  AFFECTED AREAS EVERY NIGHT AT BEDTIME  2   fluconazole (DIFLUCAN) 100 MG tablet TK 1 T PO D     HYDROcodone-acetaminophen (NORCO/VICODIN) 5-325 MG tablet Take 1 tablet by mouth every 6 (six) hours as needed for moderate pain. 30 tablet 0   ivermectin (STROMECTOL) 3 MG TABS tablet      lidocaine-prilocaine (EMLA) cream Apply 1 application topically as needed. Apply to skin 1 hour prior to arrival 30 g 0   meloxicam (MOBIC) 15 MG tablet Take 1 tablet (15 mg total) by mouth daily. 30 tablet 1   methylPREDNISolone (MEDROL DOSEPAK) 4 MG TBPK tablet 6 day dose pack - take as directed 21 tablet 0   mupirocin ointment (BACTROBAN) 2 % APP AA TID FOR 10 TO 14 DAYS  0   permethrin (ELIMITE) 5 % cream      progesterone (PROMETRIUM) 100 MG capsule   10   progesterone (PROMETRIUM) 100 MG capsule Take  100 mg by mouth daily.     spironolactone (ALDACTONE) 50 MG tablet TK 1 T PO  QD  7   Vitamin D, Ergocalciferol, (DRISDOL) 50000 units CAPS capsule TAKE 1 CAPSULE PO ONCE WEEKLY  3   No facility-administered medications prior to visit.    Review of Systems  Constitutional:  Negative for chills, fever, malaise/fatigue and weight loss.  HENT:  Negative for congestion, sinus pain and sore throat.   Eyes: Negative.   Respiratory:  Positive for cough. Negative for hemoptysis, sputum production, shortness of breath and wheezing.   Cardiovascular:  Negative for chest pain, palpitations, orthopnea, claudication and leg swelling.  Gastrointestinal:  Negative for abdominal pain, heartburn, nausea and vomiting.  Genitourinary: Negative.   Musculoskeletal:  Negative for joint pain and myalgias.  Skin:  Negative for rash.  Neurological:  Negative for weakness.  Endo/Heme/Allergies: Negative.   Psychiatric/Behavioral: Negative.       Objective:   Vitals:   07/10/21 1330  BP: 116/72  Pulse: 71  SpO2: 100%  Weight: 145 lb 9.6 oz (66 kg)  Height: 5\' 4"  (1.626 m)     Physical Exam Constitutional:      General: She is not in acute distress.    Appearance: She is not ill-appearing.  HENT:     Head: Normocephalic and atraumatic.     Nose: Nose normal.     Mouth/Throat:     Mouth: Mucous membranes are moist.     Pharynx: Oropharynx is clear. No oropharyngeal exudate or posterior oropharyngeal erythema.  Eyes:     General: No scleral icterus.    Conjunctiva/sclera: Conjunctivae normal.     Pupils: Pupils are equal, round, and reactive to light.  Cardiovascular:     Rate and Rhythm: Normal rate and regular rhythm.     Pulses: Normal pulses.     Heart sounds: Normal heart sounds. No murmur heard. Pulmonary:     Effort: Pulmonary effort is normal.     Breath sounds: Normal breath sounds. No wheezing, rhonchi or rales.  Abdominal:     General: Bowel sounds are normal.     Palpations:  Abdomen is soft.  Musculoskeletal:     Right lower leg: No edema.     Left lower leg: No edema.  Lymphadenopathy:     Cervical: No cervical adenopathy.  Skin:    General: Skin is warm and dry.  Neurological:     General: No focal deficit present.     Mental Status: She is alert.  Psychiatric:  Mood and Affect: Mood normal.        Behavior: Behavior normal.        Thought Content: Thought content normal.        Judgment: Judgment normal.   CBC    Component Value Date/Time   HGB 13.4 05/09/2009 0947   Chest imaging: CXR 05/29/21 Clear lungs bilaterally  PFT:     View : No data to display.          Labs:  Path:  Echo:  Heart Catheterization:  Assessment & Plan:   Chronic cough - Plan: pantoprazole (PROTONIX) 40 MG tablet  Gastroesophageal reflux disease without esophagitis - Plan: pantoprazole (PROTONIX) 40 MG tablet  Discussion: Vanessa Harris is a 66 year old female, never smoker who is referred to pulmonary clinic for chronic cough.   Her cough appears related to GERD as she has random episodes of dry cough with night time awakenings along with hoarseness of her voice.   She is to eat dinner at least 2 hours prior to bedtime. She is to take pantoprazole 30 minutes prior to breakfast. She is to avoid alcohol use in the evenings. Recommend sleeping with head of bed elevated.  Follow up in 4 weeks via video visit. If no improvement we will consider PFTs and CT Chest scan for further evaluation.  Vanessa Jackson, MD [facility] Office: [phone]    Current Outpatient Medications:    buPROPion [facility] SR) 150 MG 12 hr tablet, Take 150 mg by mouth 2 (two) times daily., Disp: , Rfl:    estradiol (VIVELLE-DOT) 0.05 MG/24HR patch, estradiol 0.05 mg/24 hr semiweekly transdermal patch  Apply 1 patch twice a week by transdermal route for 84 days., Disp: , Rfl:    pantoprazole (PROTONIX) 40 MG tablet, Take 1 tablet (40 mg total) by  mouth daily., Disp: 30 tablet, Rfl: 3   progesterone (PROMETRIUM) 100 MG capsule, Take 100 mg by mouth daily., Disp: , Rfl:    spironolactone (ALDACTONE) 50 MG tablet, Take 50 mg by mouth daily., Disp: , Rfl:

## 2021-07-19 DIAGNOSIS — Z124 Encounter for screening for malignant neoplasm of cervix: Secondary | ICD-10-CM | POA: Diagnosis not present

## 2021-07-19 DIAGNOSIS — Z1231 Encounter for screening mammogram for malignant neoplasm of breast: Secondary | ICD-10-CM | POA: Diagnosis not present

## 2021-07-19 DIAGNOSIS — Z6824 Body mass index (BMI) 24.0-24.9, adult: Secondary | ICD-10-CM | POA: Diagnosis not present

## 2021-07-19 DIAGNOSIS — R6882 Decreased libido: Secondary | ICD-10-CM | POA: Diagnosis not present

## 2021-07-19 DIAGNOSIS — N390 Urinary tract infection, site not specified: Secondary | ICD-10-CM | POA: Diagnosis not present

## 2021-08-15 ENCOUNTER — Ambulatory Visit: Payer: Medicare Other | Admitting: Pulmonary Disease

## 2021-09-17 ENCOUNTER — Encounter: Payer: Self-pay | Admitting: Pulmonary Disease

## 2021-09-17 ENCOUNTER — Ambulatory Visit (INDEPENDENT_AMBULATORY_CARE_PROVIDER_SITE_OTHER): Payer: Medicare Other | Admitting: Pulmonary Disease

## 2021-09-17 VITALS — BP 98/60 | HR 74 | Temp 98.1°F | Ht 63.0 in | Wt 142.6 lb

## 2021-09-17 DIAGNOSIS — R490 Dysphonia: Secondary | ICD-10-CM | POA: Diagnosis not present

## 2021-09-17 DIAGNOSIS — K219 Gastro-esophageal reflux disease without esophagitis: Secondary | ICD-10-CM | POA: Diagnosis not present

## 2021-09-17 DIAGNOSIS — R053 Chronic cough: Secondary | ICD-10-CM

## 2021-09-17 MED ORDER — IPRATROPIUM BROMIDE 0.03 % NA SOLN: 2.0000 | Freq: Two times a day (BID) | NASAL | 12 refills | Status: AC

## 2021-09-17 MED ORDER — FAMOTIDINE 20 MG PO TABS: 20.0000 mg | ORAL_TABLET | Freq: Every day | ORAL | 3 refills | Status: DC

## 2021-09-17 MED ORDER — PANTOPRAZOLE SODIUM 40 MG PO TBEC: 40.0000 mg | DELAYED_RELEASE_TABLET | Freq: Every day | ORAL | 3 refills | Status: DC

## 2021-09-17 NOTE — Progress Notes (Signed)
Synopsis: Referred in May 2023 for chronic cough by Reynold Bowen, MD  Subjective:   PATIENT ID: Vanessa Harris GENDER: female DOB: 08/13/1955, MRN: [id number]  HPI  Chief Complaint  Patient presents with   Follow-up    Follow-up, still cough, started taking Pantoprazole on empty stomach.   Vanessa Harris is a 66 year old woman, never smoker who returns to pulmonary clinic for chronic cough.   She reports her cough is less severe and it seemed to improved with taking PPI on empty stomach. She is sleeping better and will occasionally wake up due to the cough. She is not eating 2 hours before bedtime. She has not elevated the head of her bed. She continues to have intermittent hoarseness of her voice.    Initial OV 07/10/21 She reports having cough since January. The cough is dry. She reports irritation in her throat and does not feel the cough is coming from her chest. She denies sinus congestion or post nasal drainage. She denies wheezing or shortness of breath with the cough. She also reports hoarseness in her voice. She denies dysphagia or coughing with eating or drinking. She reports the cough can happen at random times during the day and can cause night time awakenings most nights. She denies overt heart burn symptoms. She was trialed on pantoprazole by her PCP without improvement.   Chest radiograph from 05/29/21 is unremarkable. Patient brought in copy of radiograph on CD for review.   She is a never smoker. She works in [employer] (medical). She drinks 1-2 vodka drinks per night. She reports eating dinner after 8pm and usually goes to bed around 10pm.   Past Medical History:  Diagnosis Date   Hyperlipidemia      Family History  Problem Relation Age of Onset   Non-Hodgkin's lymphoma Mother    Myelodysplastic syndrome Mother    Diabetes Father    Congestive Heart Failure Father      Social History   Socioeconomic History   Marital status: Married    Spouse name: Not on file    Number of children: Not on file   Years of education: Not on file   Highest education level: Not on file  Occupational History   Not on file  Tobacco Use   Smoking status: Never   Smokeless tobacco: Never  Substance and Sexual Activity   Alcohol use: Yes   Drug use: Not on file   Sexual activity: Not on file  Other Topics Concern   Not on file  Social History Narrative   Not on file   Social Determinants of Health   Financial Resource Strain: Not on file  Food Insecurity: Not on file  Transportation Needs: Not on file  Physical Activity: Not on file  Stress: Not on file  Social Connections: Not on file  Intimate Partner Violence: Not on file     No Known Allergies   Outpatient Medications Prior to Visit  Medication Sig Dispense Refill   buPROPion (WELLBUTRIN SR) 150 MG 12 hr tablet Take 150 mg by mouth 2 (two) times daily.     estradiol (VIVELLE-DOT) 0.05 MG/24HR patch estradiol 0.05 mg/24 hr semiweekly transdermal patch  Apply 1 patch twice a week by transdermal route for 84 days.     progesterone (PROMETRIUM) 100 MG capsule Take 100 mg by mouth daily.     spironolactone (ALDACTONE) 50 MG tablet Take 50 mg by mouth daily.     pantoprazole (PROTONIX) 40 MG tablet Take 1  tablet (40 mg total) by mouth daily. 30 tablet 3   No facility-administered medications prior to visit.   Review of Systems  Constitutional:  Negative for chills, fever, malaise/fatigue and weight loss.  HENT:  Negative for congestion, sinus pain and sore throat.   Eyes: Negative.   Respiratory:  Positive for cough. Negative for hemoptysis, sputum production, shortness of breath and wheezing.   Cardiovascular:  Negative for chest pain, palpitations, orthopnea, claudication and leg swelling.  Gastrointestinal:  Negative for abdominal pain, heartburn, nausea and vomiting.  Genitourinary: Negative.   Musculoskeletal:  Negative for joint pain and myalgias.  Skin:  Negative for rash.  Neurological:   Negative for weakness.  Endo/Heme/Allergies: Negative.   Psychiatric/Behavioral: Negative.      Objective:   Vitals:   09/17/21 1345  BP: 98/60  Pulse: 74  Temp: 98.1 F (36.7 C)  TempSrc: Oral  SpO2: 98%  Weight: 142 lb 9.6 oz (64.7 kg)  Height: '5\' 3"'$  (1.6 m)     Physical Exam Constitutional:      General: She is not in acute distress.    Appearance: She is not ill-appearing.  HENT:     Head: Normocephalic and atraumatic.     Nose: Nose normal.     Mouth/Throat:     Mouth: Mucous membranes are moist.     Pharynx: Oropharynx is clear. No oropharyngeal exudate or posterior oropharyngeal erythema.  Cardiovascular:     Rate and Rhythm: Normal rate and regular rhythm.     Pulses: Normal pulses.     Heart sounds: Normal heart sounds. No murmur heard. Pulmonary:     Effort: Pulmonary effort is normal.     Breath sounds: Normal breath sounds. No wheezing, rhonchi or rales.  Musculoskeletal:     Right lower leg: No edema.     Left lower leg: No edema.  Skin:    General: Skin is warm and dry.  Neurological:     General: No focal deficit present.     Mental Status: She is alert.  Psychiatric:        Mood and Affect: Mood normal.        Behavior: Behavior normal.        Thought Content: Thought content normal.        Judgment: Judgment normal.    CBC    Component Value Date/Time   HGB 13.4 05/09/2009 0947   Chest imaging: CXR 05/29/21 Clear lungs bilaterally  PFT:     No data to display          Labs:  Path:  Echo:  Heart Catheterization:  Assessment & Plan:   Chronic cough - Plan: pantoprazole (PROTONIX) 40 MG tablet, Ambulatory referral to ENT, ipratropium (ATROVENT) 0.03 % nasal spray  Gastroesophageal reflux disease without esophagitis - Plan: pantoprazole (PROTONIX) 40 MG tablet, famotidine (PEPCID) 20 MG tablet  Hoarseness - Plan: Ambulatory referral to ENT  Discussion: Vanessa Harris is a 66 year old woman, never smoker who is referred  to pulmonary clinic for chronic cough.   Her cough appears related to GERD as she has random episodes of dry cough with night time awakenings along with hoarseness of her voice. The cough has improved with '40mg'$  pantoprazole daily 30 minutes prior to breakfast.  She is to eat dinner at least 2 hours prior to bedtime.She is to avoid alcohol use in the evenings. Recommend sleeping with head of bed elevated.  We will refer her to ENT for further evaluation of the  cough and hoarseness of voice. She is to start ipratropium nasal spray for nasal drainage.   Follow up in 4 weeks via video visit. If ENT evaluation is unrevealing we will check PFTs and CT Chest scan for further evaluation.  Freda Jackson, MD [facility] Office: [phone]    Current Outpatient Medications:    buPROPion [facility] SR) 150 MG 12 hr tablet, Take 150 mg by mouth 2 (two) times daily., Disp: , Rfl:    estradiol (VIVELLE-DOT) 0.05 MG/24HR patch, estradiol 0.05 mg/24 hr semiweekly transdermal patch  Apply 1 patch twice a week by transdermal route for 84 days., Disp: , Rfl:    famotidine (PEPCID) 20 MG tablet, Take 1 tablet (20 mg total) by mouth at bedtime., Disp: 30 tablet, Rfl: 3   ipratropium (ATROVENT) 0.03 % nasal spray, Place 2 sprays into both nostrils every 12 (twelve) hours., Disp: 30 mL, Rfl: 12   progesterone (PROMETRIUM) 100 MG capsule, Take 100 mg by mouth daily., Disp: , Rfl:    spironolactone (ALDACTONE) 50 MG tablet, Take 50 mg by mouth daily., Disp: , Rfl:    pantoprazole (PROTONIX) 40 MG tablet, Take 1 tablet (40 mg total) by mouth daily., Disp: 30 tablet, Rfl: 3

## 2021-09-17 NOTE — Patient Instructions (Addendum)
Take pantoprazole '40mg'$  daily 30 minutes before breakfast   Eat dinner at least 2 hours before bedtime.    Avoid alcohol in the evenings for 2-4 weeks  Start famotidine '20mg'$  at bedtime  Recommend sleeping with your head elevated to avoid nocturnal reflux disease. You can use a wedge pillow or elevated the bedfram 4-6 inches with blocks.  Ipratropium nasal spray 2 sprays per nostril twice daily  We will refer you to ENT for further evaluation of the voice hoarseness and cough  We will check a CT Chest scan and breathing tests if the ENT evaluation is unrevealing.   Follow up in 4 weeks

## 2021-09-22 ENCOUNTER — Encounter: Payer: Self-pay | Admitting: Pulmonary Disease

## 2021-10-31 ENCOUNTER — Encounter: Payer: Self-pay | Admitting: Pulmonary Disease

## 2021-10-31 ENCOUNTER — Ambulatory Visit (INDEPENDENT_AMBULATORY_CARE_PROVIDER_SITE_OTHER): Payer: Medicare Other | Admitting: Pulmonary Disease

## 2021-10-31 VITALS — BP 118/68 | HR 77 | Temp 98.4°F | Ht 63.0 in | Wt 143.6 lb

## 2021-10-31 DIAGNOSIS — R053 Chronic cough: Secondary | ICD-10-CM | POA: Diagnosis not present

## 2021-10-31 NOTE — Progress Notes (Signed)
Synopsis: Referred in May 2023 for chronic cough by Reynold Bowen, MD  Subjective:   PATIENT ID: Vanessa Harris GENDER: female DOB: [birth year], MRN: [id number]  HPI  Chief Complaint  Patient presents with   Follow-up    Pt is here for follow up for cough. Pt has seen ENT doctor. Pt is taking pepcid, omeprazole and tessalon pearls. Pt states the cough is not getting worse.    Vanessa Harris is a 66 year old woman, never smoker who returns to pulmonary clinic for chronic cough.   She was referred to ENT for hoarseness in her voice and started on ipratropium nasal spray at last visit. She saw ENT 10/18/21 and flexible laryngoscopy was performed and notable for abundant pharyngeal mucous consistent with laryngopharyngeal reflux disease. Her vocal cords were unremarkable. Her PPI therapy was increased to twice daily use and she has not been using it regularly in the evenings. The cough is somewhat improved on PPI therapy, but still remains.  She continues to drink alcohol each evening. She is drinking iced tea throughout the day with caffeine.  OV 09/17/21 She reports her cough is less severe and it seemed to improved with taking PPI on empty stomach. She is sleeping better and will occasionally wake up due to the cough. She is not eating 2 hours before bedtime. She has not elevated the head of her bed. She continues to have intermittent hoarseness of her voice.    Initial OV 07/10/21 She reports having cough since January. The cough is dry. She reports irritation in her throat and does not feel the cough is coming from her chest. She denies sinus congestion or post nasal drainage. She denies wheezing or shortness of breath with the cough. She also reports hoarseness in her voice. She denies dysphagia or coughing with eating or drinking. She reports the cough can happen at random times during the day and can cause night time awakenings most nights. She denies overt heart burn symptoms. She  was trialed on pantoprazole by her PCP without improvement.   Chest radiograph from 05/29/21 is unremarkable. Patient brought in copy of radiograph on CD for review.   She is a never smoker. She works in [employer], research (medical). She drinks 1-2 vodka drinks per night. She reports eating dinner after 8pm and usually goes to bed around 10pm.   Past Medical History:  Diagnosis Date   Hyperlipidemia      Family History  Problem Relation Age of Onset   Non-Hodgkin's lymphoma Mother    Myelodysplastic syndrome Mother    Diabetes Father    Congestive Heart Failure Father      Social History   Socioeconomic History   Marital status: Married    Spouse name: Not on file   Number of children: Not on file   Years of education: Not on file   Highest education level: Not on file  Occupational History   Not on file  Tobacco Use   Smoking status: Never   Smokeless tobacco: Never  Substance and Sexual Activity   Alcohol use: Yes   Drug use: Not on file   Sexual activity: Not on file  Other Topics Concern   Not on file  Social History Narrative   Not on file   Social Determinants of Health   Financial Resource Strain: Not on file  Food Insecurity: Not on file  Transportation Needs: Not on file  Physical Activity: Not on file  Stress: Not on file  Social Connections: Not  on file  Intimate Partner Violence: Not on file     No Known Allergies   Outpatient Medications Prior to Visit  Medication Sig Dispense Refill   benzonatate (TESSALON) 100 MG capsule 1 capsule as needed Orally Three times a day for 30 days     buPROPion (WELLBUTRIN SR) 150 MG 12 hr tablet Take 150 mg by mouth 2 (two) times daily.     estradiol (VIVELLE-DOT) 0.05 MG/24HR patch estradiol 0.05 mg/24 hr semiweekly transdermal patch  Apply 1 patch twice a week by transdermal route for 84 days.     famotidine (PEPCID) 20 MG tablet Take 1 tablet (20 mg total) by mouth at bedtime. 30 tablet 3   ipratropium (ATROVENT) 0.03 % nasal spray  Place 2 sprays into both nostrils every 12 (twelve) hours. 30 mL 12   omeprazole (PRILOSEC) 20 MG capsule Take 20 mg by mouth 2 (two) times daily.     pantoprazole (PROTONIX) 40 MG tablet Take 1 tablet (40 mg total) by mouth daily. 30 tablet 3   progesterone (PROMETRIUM) 100 MG capsule Take 100 mg by mouth daily.     spironolactone (ALDACTONE) 50 MG tablet Take 50 mg by mouth daily.     No facility-administered medications prior to visit.   Review of Systems  Constitutional:  Negative for chills, fever, malaise/fatigue and weight loss.  HENT:  Negative for congestion, sinus pain and sore throat.   Eyes: Negative.   Respiratory:  Positive for cough. Negative for hemoptysis, sputum production, shortness of breath and wheezing.   Cardiovascular:  Negative for chest pain, palpitations, orthopnea, claudication and leg swelling.  Gastrointestinal:  Negative for abdominal pain, heartburn, nausea and vomiting.  Genitourinary: Negative.   Musculoskeletal:  Negative for joint pain and myalgias.  Skin:  Negative for rash.  Neurological:  Negative for weakness.  Endo/Heme/Allergies: Negative.   Psychiatric/Behavioral: Negative.      Objective:   Vitals:   10/31/21 1017  BP: 118/68  Pulse: 77  Temp: 98.4 F (36.9 C)  TempSrc: Oral  SpO2: 97%  Weight: 143 lb 9.6 oz (65.1 kg)  Height: '5\' 3"'$  (1.6 m)     Physical Exam Constitutional:      General: She is not in acute distress.    Appearance: She is not ill-appearing.  HENT:     Head: Normocephalic and atraumatic.     Nose: Nose normal.     Mouth/Throat:     Mouth: Mucous membranes are moist.     Pharynx: Oropharynx is clear. No oropharyngeal exudate or posterior oropharyngeal erythema.  Cardiovascular:     Rate and Rhythm: Normal rate and regular rhythm.     Pulses: Normal pulses.     Heart sounds: Normal heart sounds. No murmur heard. Pulmonary:     Effort: Pulmonary effort is normal.     Breath sounds: Normal breath sounds. No  wheezing, rhonchi or rales.  Musculoskeletal:     Right lower leg: No edema.     Left lower leg: No edema.  Skin:    General: Skin is warm and dry.  Neurological:     General: No focal deficit present.     Mental Status: She is alert.  Psychiatric:        Mood and Affect: Mood normal.        Behavior: Behavior normal.        Thought Content: Thought content normal.        Judgment: Judgment normal.    CBC  Component Value Date/Time   HGB 13.4 05/09/2009 0947   Chest imaging: CXR 05/29/21 Clear lungs bilaterally  PFT:     No data to display          Labs:  Path:  Echo:  Heart Catheterization:  Assessment & Plan:   No diagnosis found.  Discussion: Vanessa Harris is a 66 year old woman, never smoker who is referred to pulmonary clinic for chronic cough.   Her cough appears related to GERD and laryngopharyngeal reflux based on ENT evaluation. She has been put on omeprazole '20mg'$  BID by ENT. She is to take 30 minutes prior to breakfast on empty stomach and again before dinner time.   She is to eat dinner at least 2 hours prior to bedtime.She is to avoid alcohol use in the evenings. Recommend sleeping with head of bed elevated. She is to stop drinking regular iced tea and switch to decaf tea.  We will check CT chest.   Follow up in 2 months.   Freda Jackson, MD [facility] Office: [phone]    Current Outpatient Medications:    benzonatate (TESSALON) 100 MG capsule, 1 capsule as needed Orally Three times a day for 30 days, Disp: , Rfl:    buPROPion (WELLBUTRIN SR) 150 MG 12 hr tablet, Take 150 mg by mouth 2 (two) times daily., Disp: , Rfl:    estradiol (VIVELLE-DOT) 0.05 MG/24HR patch, estradiol 0.05 mg/24 hr semiweekly transdermal patch  Apply 1 patch twice a week by transdermal route for 84 days., Disp: , Rfl:    famotidine (PEPCID) 20 MG tablet, Take 1 tablet (20 mg total) by mouth at bedtime., Disp: 30 tablet, Rfl: 3    ipratropium (ATROVENT) 0.03 % nasal spray, Place 2 sprays into both nostrils every 12 (twelve) hours., Disp: 30 mL, Rfl: 12   omeprazole (PRILOSEC) 20 MG capsule, Take 20 mg by mouth 2 (two) times daily., Disp: , Rfl:    pantoprazole (PROTONIX) 40 MG tablet, Take 1 tablet (40 mg total) by mouth daily., Disp: 30 tablet, Rfl: 3   progesterone (PROMETRIUM) 100 MG capsule, Take 100 mg by mouth daily., Disp: , Rfl:    spironolactone (ALDACTONE) 50 MG tablet, Take 50 mg by mouth daily., Disp: , Rfl:

## 2021-10-31 NOTE — Patient Instructions (Addendum)
Stop pantoprazole  Stop famotidine if still at home  Continue taking omeprazole '20mg'$  twice daily from ENT team - take on empty stomach in morning time 30 minutes before eating or drinking coffee.  - take again in evening time before dinner or at bedtime  Recommend taking a break from drinking alcohol beverages for 2 to 4 weeks  Recommend drinking decaf tea throughout the day  We will check CT Chest scan to further evaluate your cough  Follow up in 2 months

## 2021-11-03 ENCOUNTER — Encounter: Payer: Self-pay | Admitting: Pulmonary Disease

## 2021-11-08 ENCOUNTER — Other Ambulatory Visit (HOSPITAL_COMMUNITY): Payer: Medicare Other

## 2021-11-15 ENCOUNTER — Ambulatory Visit (HOSPITAL_COMMUNITY)
Admission: RE | Admit: 2021-11-15 | Discharge: 2021-11-15 | Disposition: A | Discharge status: Home / Self Care | Payer: Medicare Other | Source: Ambulatory Visit | Attending: Pulmonary Disease | Admitting: Pulmonary Disease

## 2021-11-15 DIAGNOSIS — R053 Chronic cough: Secondary | ICD-10-CM

## 2021-11-27 ENCOUNTER — Telehealth: Payer: Self-pay | Admitting: Pulmonary Disease

## 2021-11-28 NOTE — Telephone Encounter (Signed)
Vanessa Starr, MD  11/24/2021  6:12 PM EDT     Please let patient know her CT chest scan shows no overt findings that would lead to her cough. Her airways and lung tissue overall look normal. There is a small lung nodule (spot on the lungs) that measures 60m in the right middle lobe. She is overall low risk for lung cancer but we will plan to check a follow up CT chest scan in 1 year.   Thanks, JD    Called and spoke with pt letting her know the results of CT and she verbalized understanding. Also scheduled pt a f/u with JD.nothing further needed.

## 2021-12-18 ENCOUNTER — Telehealth: Payer: Self-pay | Admitting: Pulmonary Disease

## 2021-12-18 DIAGNOSIS — K219 Gastro-esophageal reflux disease without esophagitis: Secondary | ICD-10-CM

## 2021-12-18 DIAGNOSIS — R053 Chronic cough: Secondary | ICD-10-CM

## 2021-12-18 NOTE — Telephone Encounter (Signed)
Called patient and she states she would like a referral to endoscopy. She states that this is do to her coughing all the time. She is wondering if her esophagus needs to be stretched.   She would like to see Dr Kelly Splinter.  She would also like a Rx for Reglan. Cause she was told by a friend that failed pharm class told her this would her cough.  Please advise sir

## 2021-12-20 NOTE — Telephone Encounter (Signed)
Patient checking on message for referral and RX. Patient phone number is [phone].

## 2021-12-20 NOTE — Telephone Encounter (Signed)
Pt aware that we are still awaiting response from [place] and will call her once he sends [place] msg back

## 2021-12-26 NOTE — Telephone Encounter (Signed)
Ok to place referral to Dr. Hilarie Fredrickson of GI for further evaluation of her reflux disease. Will not prescribe reglan at this time until further workup by GI.  Thanks, JD

## 2021-12-27 NOTE — Telephone Encounter (Signed)
ATC x1.  LVM to return call. 

## 2021-12-28 NOTE — Telephone Encounter (Signed)
Spoke with pt and reviewed Dr. August Albino advise as dictated. Placed GI referral. Pt stated understanding. Nothing further needed at this time.

## 2022-01-10 ENCOUNTER — Encounter: Payer: Self-pay | Admitting: Pulmonary Disease

## 2022-01-10 ENCOUNTER — Ambulatory Visit (INDEPENDENT_AMBULATORY_CARE_PROVIDER_SITE_OTHER): Payer: Medicare Other | Admitting: Pulmonary Disease

## 2022-01-10 ENCOUNTER — Other Ambulatory Visit (HOSPITAL_COMMUNITY): Payer: Self-pay

## 2022-01-10 VITALS — BP 114/72 | HR 75 | Ht 63.0 in | Wt 144.0 lb

## 2022-01-10 DIAGNOSIS — R053 Chronic cough: Secondary | ICD-10-CM

## 2022-01-10 DIAGNOSIS — R490 Dysphonia: Secondary | ICD-10-CM

## 2022-01-10 DIAGNOSIS — K219 Gastro-esophageal reflux disease without esophagitis: Secondary | ICD-10-CM

## 2022-01-10 DIAGNOSIS — R1310 Dysphagia, unspecified: Secondary | ICD-10-CM

## 2022-01-10 DIAGNOSIS — R059 Cough, unspecified: Secondary | ICD-10-CM

## 2022-01-10 MED ORDER — FLUTICASONE-SALMETEROL 115-21 MCG/ACT IN AERO: 2.0000 | INHALATION_SPRAY | Freq: Two times a day (BID) | RESPIRATORY_TRACT | 12 refills | Status: DC

## 2022-01-10 MED ORDER — PANTOPRAZOLE SODIUM 40 MG PO TBEC: 40.0000 mg | DELAYED_RELEASE_TABLET | Freq: Every day | ORAL | 6 refills | Status: DC

## 2022-01-10 MED ORDER — BENZONATATE 200 MG PO CAPS: 200.0000 mg | ORAL_CAPSULE | Freq: Three times a day (TID) | ORAL | 1 refills | Status: DC | PRN

## 2022-01-10 MED ORDER — FAMOTIDINE 20 MG PO TABS: 20.0000 mg | ORAL_TABLET | Freq: Two times a day (BID) | ORAL | 6 refills | Status: DC

## 2022-01-10 NOTE — Patient Instructions (Addendum)
Stop omeprazole twice a day  Start pantoprazole '40mg'$  daily - take 30 minutes before breakfast  Start famotidine '20mg'$  at bedtime  We will check on your referral to the GI doctors  We will refer you to speech therapy for modified barium swallow and evaluation of chronic cough  Use benzonatate capsule 3 times per day as needed for cough  Your CT scan does not indicate abnormalities of the airways or lungs.   Start advair HFA 115-38mg 2 puffs twice daily - rinse mouth out after each use  Follow up in 2 months

## 2022-01-10 NOTE — Progress Notes (Signed)
Synopsis: Referred in May 2023 for chronic cough by Reynold Bowen, MD  Subjective:   PATIENT ID: Vanessa Harris GENDER: female DOB: 1955-03-28, MRN: [id number]  HPI  Chief Complaint  Patient presents with   Follow-up    2 mo f/u for cough. States her cough has not changed since last visit. Medications are not working.    Vanessa Harris is a 66 year old woman, never smoker who returns to pulmonary clinic for chronic cough.   She reports cutting back on alcohol. She is not drinking every day now. She continues to eat dinner late at times. She has cut out ice tea throughout the day and is drinking water. She continues to have a dry cough without much improvement. She has been on omeprazole '20mg'$  BID. She reports she felt pantoprazole '40mg'$  daily gave better benefit when tried before.  Referral to GI has been placed.   CT of the chest does not show any airway or parenchymal abnormalities.   OV 10/31/21 She was referred to ENT for hoarseness in her voice and started on ipratropium nasal spray at last visit. She saw ENT 10/18/21 and flexible laryngoscopy was performed and notable for abundant pharyngeal mucous consistent with laryngopharyngeal reflux disease. Her vocal cords were unremarkable. Her PPI therapy was increased to twice daily use and she has not been using it regularly in the evenings. The cough is somewhat improved on PPI therapy, but still remains.  She continues to drink alcohol each evening. She is drinking iced tea throughout the day with caffeine.  OV 09/17/21 She reports her cough is less severe and it seemed to improved with taking PPI on empty stomach. She is sleeping better and will occasionally wake up due to the cough. She is not eating 2 hours before bedtime. She has not elevated the head of her bed. She continues to have intermittent hoarseness of her voice.    Initial OV 07/10/21 She reports having cough since January. The cough is dry. She reports irritation in  her throat and does not feel the cough is coming from her chest. She denies sinus congestion or post nasal drainage. She denies wheezing or shortness of breath with the cough. She also reports hoarseness in her voice. She denies dysphagia or coughing with eating or drinking. She reports the cough can happen at random times during the day and can cause night time awakenings most nights. She denies overt heart burn symptoms. She was trialed on pantoprazole by her PCP without improvement.   Chest radiograph from 05/29/21 is unremarkable. Patient brought in copy of radiograph on CD for review.   She is a never smoker. She works in [employer], [employer] (medical). She drinks 1-2 vodka drinks per night. She reports eating dinner after 8pm and usually goes to bed around 10pm.   Past Medical History:  Diagnosis Date   Hyperlipidemia      Family History  Problem Relation Age of Onset   Non-Hodgkin's lymphoma Mother    Myelodysplastic syndrome Mother    Diabetes Father    Congestive Heart Failure Father      Social History   Socioeconomic History   Marital status: Married    Spouse name: Not on file   Number of children: Not on file   Years of education: Not on file   Highest education level: Not on file  Occupational History   Not on file  Tobacco Use   Smoking status: Never   Smokeless tobacco: Never  Substance and Sexual Activity  Alcohol use: Yes   Drug use: Not on file   Sexual activity: Not on file  Other Topics Concern   Not on file  Social History Narrative   Not on file   Social Determinants of Health   Financial Resource Strain: Not on file  Food Insecurity: Not on file  Transportation Needs: Not on file  Physical Activity: Not on file  Stress: Not on file  Social Connections: Not on file  Intimate Partner Violence: Not on file     No Known Allergies   Outpatient Medications Prior to Visit  Medication Sig Dispense Refill   buPROPion (WELLBUTRIN SR) 150 MG 12 hr tablet Take 150 mg by  mouth 2 (two) times daily.     estradiol (VIVELLE-DOT) 0.05 MG/24HR patch estradiol 0.05 mg/24 hr semiweekly transdermal patch  Apply 1 patch twice a week by transdermal route for 84 days.     ipratropium (ATROVENT) 0.03 % nasal spray Place 2 sprays into both nostrils every 12 (twelve) hours. 30 mL 12   progesterone (PROMETRIUM) 100 MG capsule Take 100 mg by mouth daily.     spironolactone (ALDACTONE) 50 MG tablet Take 50 mg by mouth daily.     benzonatate (TESSALON) 100 MG capsule 1 capsule as needed Orally Three times a day for 30 days     omeprazole (PRILOSEC) 20 MG capsule Take 20 mg by mouth 2 (two) times daily.     No facility-administered medications prior to visit.   Review of Systems  Constitutional:  Negative for chills, fever, malaise/fatigue and weight loss.  HENT:  Negative for congestion, sinus pain and sore throat.   Eyes: Negative.   Respiratory:  Positive for cough. Negative for hemoptysis, sputum production, shortness of breath and wheezing.   Cardiovascular:  Negative for chest pain, palpitations, orthopnea, claudication and leg swelling.  Gastrointestinal:  Negative for abdominal pain, heartburn, nausea and vomiting.  Genitourinary: Negative.   Musculoskeletal:  Negative for joint pain and myalgias.  Skin:  Negative for rash.  Neurological:  Negative for weakness.  Endo/Heme/Allergies: Negative.   Psychiatric/Behavioral: Negative.      Objective:   Vitals:   01/10/22 0909  BP: 114/72  Pulse: 75  SpO2: 100%  Weight: 144 lb (65.3 kg)  Height: '5\' 3"'$  (1.6 m)     Physical Exam Constitutional:      General: She is not in acute distress.    Appearance: She is not ill-appearing.  HENT:     Head: Normocephalic and atraumatic.     Nose: Nose normal.     Mouth/Throat:     Mouth: Mucous membranes are moist.     Pharynx: Oropharynx is clear. No oropharyngeal exudate or posterior oropharyngeal erythema.  Cardiovascular:     Rate and Rhythm: Normal rate and  regular rhythm.     Pulses: Normal pulses.     Heart sounds: Normal heart sounds. No murmur heard. Pulmonary:     Effort: Pulmonary effort is normal.     Breath sounds: Normal breath sounds. No wheezing, rhonchi or rales.  Musculoskeletal:     Right lower leg: No edema.     Left lower leg: No edema.  Skin:    General: Skin is warm and dry.  Neurological:     General: No focal deficit present.     Mental Status: She is alert.  Psychiatric:        Mood and Affect: Mood normal.        Behavior: Behavior normal.  Thought Content: Thought content normal.        Judgment: Judgment normal.    CBC    Component Value Date/Time   HGB 13.4 05/09/2009 0947   Chest imaging: CT Chest 11/15/21 1. No evidence of interstitial lung disease. 2. Scattered subpleural nodules measure up to 5 mm. No follow-up needed if patient is low-risk (and has no known or suspected primary neoplasm). Non-contrast chest CT can be considered in 12 months if patient is high-risk. This recommendation follows the consensus statement: Guidelines for Management of Incidental Pulmonary Nodules Detected on CT Images: From the Fleischner Society 2017; Radiology 2017; 284:228-243. 3.  Aortic atherosclerosis (ICD10-I70.0).  CXR 05/29/21 Clear lungs bilaterally  PFT:     No data to display          Labs:  Path:  Echo:  Heart Catheterization:  Assessment & Plan:   Chronic cough - Plan: SLP modified barium swallow, benzonatate (TESSALON) 200 MG capsule, fluticasone-salmeterol (ADVAIR HFA) 115-21 MCG/ACT inhaler  Gastroesophageal reflux disease without esophagitis - Plan: SLP modified barium swallow, pantoprazole (PROTONIX) 40 MG tablet, famotidine (PEPCID) 20 MG tablet  Hoarseness  Discussion: Vanessa Harris is a 66 year old woman, never smoker who is referred to pulmonary clinic for chronic cough.   Her cough appears related to GERD and laryngopharyngeal reflux based on ENT evaluation. She is  to resume '40mg'$  pantoprazole 30 minutes before breakfast and '20mg'$  famotidine at bedtime.   We have placed referral to GI for further evaluation and input.   She is to eat dinner at least 2 hours prior to bedtime.She is to avoid alcohol use in the evenings. Recommend sleeping with head of bed elevated.   We will refer her to speech therapy for modified barium swallow and further evaluation of her cough.   She is to start empiric inhaler therapy with advair 115-36mg 2 puffs twice daily and monitor for benefit. She can take benzonatate capsules 3 times per day as needed for cough.  Follow up in 2 months.   JFreda Jackson MD [facility] Office: [phone]   Current Outpatient Medications:    benzonatate (TESSALON) 200 MG capsule, Take 1 capsule (200 mg total) by mouth 3 (three) times daily as needed for cough., Disp: 30 capsule, Rfl: 1   buPROPion (WELLBUTRIN SR) 150 MG 12 hr tablet, Take 150 mg by mouth 2 (two) times daily., Disp: , Rfl:    estradiol (VIVELLE-DOT) 0.05 MG/24HR patch, estradiol 0.05 mg/24 hr semiweekly transdermal patch  Apply 1 patch twice a week by transdermal route for 84 days., Disp: , Rfl:    famotidine (PEPCID) 20 MG tablet, Take 1 tablet (20 mg total) by mouth 2 (two) times daily., Disp: 30 tablet, Rfl: 6   fluticasone-salmeterol (ADVAIR HFA) 115-21 MCG/ACT inhaler, Inhale 2 puffs into the lungs 2 (two) times daily., Disp: 1 each, Rfl: 12   ipratropium (ATROVENT) 0.03 % nasal spray, Place 2 sprays into both nostrils every 12 (twelve) hours., Disp: 30 mL, Rfl: 12   pantoprazole (PROTONIX) 40 MG tablet, Take 1 tablet (40 mg total) by mouth daily., Disp: 30 tablet, Rfl: 6   progesterone (PROMETRIUM) 100 MG capsule, Take 100 mg by mouth daily., Disp: , Rfl:    spironolactone (ALDACTONE) 50 MG tablet, Take 50 mg by mouth daily., Disp: , Rfl:

## 2022-01-15 ENCOUNTER — Encounter: Payer: Self-pay | Admitting: Physician Assistant

## 2022-01-16 ENCOUNTER — Encounter: Payer: Self-pay | Admitting: Pulmonary Disease

## 2022-01-25 ENCOUNTER — Ambulatory Visit (HOSPITAL_COMMUNITY)
Admission: RE | Admit: 2022-01-25 | Discharge: 2022-01-25 | Disposition: A | Discharge status: Home / Self Care | Payer: Medicare Other | Source: Ambulatory Visit | Attending: Pulmonary Disease | Admitting: Pulmonary Disease

## 2022-01-25 ENCOUNTER — Ambulatory Visit (HOSPITAL_COMMUNITY)
Admission: RE | Admit: 2022-01-25 | Discharge: 2022-01-25 | Disposition: A | Discharge status: Home / Self Care | Payer: Medicare Other | Source: Ambulatory Visit

## 2022-01-25 DIAGNOSIS — R1310 Dysphagia, unspecified: Secondary | ICD-10-CM | POA: Insufficient documentation

## 2022-01-25 DIAGNOSIS — K219 Gastro-esophageal reflux disease without esophagitis: Secondary | ICD-10-CM | POA: Diagnosis not present

## 2022-01-25 DIAGNOSIS — R053 Chronic cough: Secondary | ICD-10-CM | POA: Diagnosis not present

## 2022-01-25 DIAGNOSIS — R059 Cough, unspecified: Secondary | ICD-10-CM

## 2022-02-05 ENCOUNTER — Telehealth: Payer: Self-pay | Admitting: Pulmonary Disease

## 2022-02-05 NOTE — Telephone Encounter (Signed)
PT has persistent cough she feels is not assoc w/acid reflux. Seeks advise from Dr. August Albino nurse. Please contact @ [phone]

## 2022-02-05 NOTE — Telephone Encounter (Signed)
Spoke with the pt  She states following reflux diet and taking meds as prescribed and her cough is getting worse  She is coughing up clear sputum  Coughing throughout the night and not sleeping  Acute visit with MW for tomorrow and I advised she she bring all meds in hand  She verbalized understanding of this

## 2022-02-06 ENCOUNTER — Encounter: Payer: Self-pay | Admitting: Internal Medicine

## 2022-02-06 ENCOUNTER — Ambulatory Visit (INDEPENDENT_AMBULATORY_CARE_PROVIDER_SITE_OTHER): Payer: Medicare Other | Admitting: Internal Medicine

## 2022-02-06 VITALS — BP 105/66 | HR 91 | Temp 98.2°F | Ht 63.5 in | Wt 142.6 lb

## 2022-02-06 DIAGNOSIS — R058 Other specified cough: Secondary | ICD-10-CM | POA: Diagnosis not present

## 2022-02-06 LAB — CBC WITH DIFFERENTIAL/PLATELET
Basophils Absolute: 0 10*3/uL (ref 0.0–0.1)
Basophils Relative: 0.3 % (ref 0.0–3.0)
Eosinophils Absolute: 0.2 10*3/uL (ref 0.0–0.7)
Eosinophils Relative: 2 % (ref 0.0–5.0)
HCT: 36.8 % (ref 36.0–46.0)
Hemoglobin: 12.6 g/dL (ref 12.0–15.0)
Lymphocytes Relative: 17.8 % (ref 12.0–46.0)
Lymphs Abs: 1.5 10*3/uL (ref 0.7–4.0)
MCHC: 34.1 g/dL (ref 30.0–36.0)
MCV: 91.7 fl (ref 78.0–100.0)
Monocytes Absolute: 0.9 10*3/uL (ref 0.1–1.0)
Monocytes Relative: 10.5 % (ref 3.0–12.0)
Neutro Abs: 5.9 10*3/uL (ref 1.4–7.7)
Neutrophils Relative %: 69.4 % (ref 43.0–77.0)
Platelets: 395 10*3/uL (ref 150.0–400.0)
RBC: 4.02 Mil/uL (ref 3.87–5.11)
RDW: 12.7 % (ref 11.5–15.5)
WBC: 8.6 10*3/uL (ref 4.0–10.5)

## 2022-02-06 MED ORDER — PREDNISONE 10 MG PO TABS: ORAL_TABLET | ORAL | 0 refills | Status: DC

## 2022-02-06 MED ORDER — FAMOTIDINE 20 MG PO TABS: ORAL_TABLET | ORAL | 11 refills | Status: DC

## 2022-02-06 MED ORDER — ACETAMINOPHEN-CODEINE 300-30 MG PO TABS: 1.0000 | ORAL_TABLET | ORAL | 0 refills | Status: AC | PRN

## 2022-02-06 NOTE — Patient Instructions (Addendum)
Take delsym two tsp every 12 hours and supplement if needed with  Tylenol #3   up to 1-2 every 4 hours to suppress the urge to cough. Swallowing water and/or using ice chips/non mint and menthol containing candies (such as lifesavers or sugarless jolly ranchers) are also effective.  You should rest your voice and avoid activities that you know make you cough.  Once you have eliminated the cough for 3 straight days try reducing the Tylenol #3 first,  then the delsym as tolerated.     Pantoprazole (protonix) 40 mg   Take  30-60 min before first meal of the day and Pepcid (famotidine)  20 mg after supper until return to office - this is the best way to tell whether stomach acid is contributing to your problem.    GERD (REFLUX)  is an extremely common cause of respiratory symptoms just like yours , many times with no obvious heartburn at all.    It can be treated with medication, but also with lifestyle changes including elevation of the head of your bed (ideally with 6 -8inch blocks under the headboard of your bed),  Smoking cessation, avoidance of late meals, excessive alcohol, and avoid fatty foods, chocolate, peppermint, colas, red wine, and acidic juices such as orange juice.  NO MINT OR MENTHOL PRODUCTS SO NO COUGH DROPS  USE SUGARLESS CANDY INSTEAD (Jolley ranchers or Stover's or Life Savers) or even ice chips will also do - the key is to swallow to prevent all throat clearing. NO OIL BASED VITAMINS - use powdered substitutes.  Avoid fish oil when coughing.   Prednisone 10 mg take  4 each am x 2 days,   2 each am x 2 days,  1 each am x 2 days and stop   For drainage / throat tickle try take CHLORPHENIRAMINE  4 mg  ("Allergy Relief" '4mg'$   at [facility] should be easiest to find in the blue box usually on bottom shelf)  take one every 4 hours as needed - extremely effective and inexpensive over the counter- may cause drowsiness so start with just a dose or two an hour before bedtime and see how  you tolerate it before trying in daytime.   For stuffy nose, sinuses,ears > advil cold and sinus as needed   Stop advair  Please remember to go to the lab department   for your tests - we will call you with the results when they are available.  Follow up is as needed

## 2022-02-06 NOTE — Progress Notes (Signed)
Vanessa Harris, female    DOB: Apr 05, 1955   MRN: [id number]   Brief patient profile:  31  never smoker started mild seasonal rhinitis rx allegra prn self- referred to pulmonary clinic 02/06/2022  for refractory cough since Jan 2023     History of Present Illness  Initial OV 07/10/21 She reports having cough since January 2023 at wedding in [place].  The cough is dry. She reports irritation in her throat and does not feel the cough is coming from her chest.  She also reports hoarseness in her voice. She denies dysphagia or coughing with eating or drinking. She reports the cough can happen at random times during the day and can cause night time awakenings most nights. She denies overt heart burn symptoms. She was trialed on pantoprazole by her PCP without improvement.   CT of the chest does not show any airway or parenchymal abnormalities.     OV 10/31/21 She was referred to ENT for hoarseness in her voice and started on ipratropium nasal spray at last visit. She saw ENT 10/18/21 and flexible laryngoscopy was performed and notable for abundant pharyngeal mucous consistent with laryngopharyngeal reflux disease. Her vocal cords were unremarkable. Her PPI therapy was increased to twice daily use and she has not been using it regularly in the evenings. The cough is somewhat improved on PPI therapy, but still remains.   01/10/22 recs per Dr Erin Fulling: Stop omeprazole twice a day Start pantoprazole 40mg  daily - take 30 minutes before breakfast Start famotidine 20mg  at bedtime We will refer you to speech therapy for modified barium swallow and evaluation of chronic cough Use benzonatate capsule 3 times per day as needed for cough Start advair HFA 115-66mcg 2 puffs twice daily - rinse mouth out after each use    02/06/2022  Pulmonary/ 1st office eval/Vanessa Harris  Chief Complaint  Patient presents with   Acute Visit    Pt states she has a productive cough with sinus issues for 1 week. Pt states she  started her reflux diet to try to help the cough.  Dyspnea:  mostly related to couhing Cough: min production 24/7 harsh upper airway pattern  Sleep: cannot in any position SABA use: ? Helps some  No obvious day to day or daytime pattern/variability or assoc excess/ purulent sputum or mucus plugs or hemoptysis or cp or chest tightness, subjective wheeze or overt sinus or hb symptoms.     Also denies any obvious fluctuation of symptoms with weather or environmental changes or other aggravating or alleviating factors except as outlined above   No unusual exposure hx or h/o childhood pna/ asthma or knowledge of premature birth.  Current Allergies, Complete Past Medical History, Past Surgical History, Family History, and Social History were reviewed in [employer] record.  ROS  The following are not active complaints unless bolded Hoarseness, sore throat, dysphagia=globus sensatation, dental problems, itching, sneezing,  nasal congestion or discharge of excess mucus or purulent secretions, ear ache,   fever, chills, sweats, unintended wt loss or wt gain, classically pleuritic or exertional cp,  orthopnea pnd or arm/hand swelling  or leg swelling, presyncope, palpitations, abdominal pain, anorexia, nausea, vomiting, diarrhea  or change in bowel habits or change in bladder habits, change in stools or change in urine, dysuria, hematuria,  rash, arthralgias, visual complaints, headache, numbness, weakness or ataxia or problems with walking or coordination,  change in mood or  memory.           Past  Medical History:  Diagnosis Date   Hyperlipidemia     Outpatient Medications Prior to Visit  Medication Sig Dispense Refill   benzonatate (TESSALON) 200 MG capsule Take 1 capsule (200 mg total) by mouth 3 (three) times daily as needed for cough. 30 capsule 1   buPROPion (WELLBUTRIN SR) 150 MG 12 hr tablet Take 150 mg by mouth 2 (two) times daily.     estradiol (VIVELLE-DOT) 0.05  MG/24HR patch estradiol 0.05 mg/24 hr semiweekly transdermal patch  Apply 1 patch twice a week by transdermal route for 84 days.     ipratropium (ATROVENT) 0.03 % nasal spray Place 2 sprays into both nostrils every 12 (twelve) hours. 30 mL 12   pantoprazole (PROTONIX) 40 MG tablet Take 1 tablet (40 mg total) by mouth daily. 30 tablet 6   progesterone (PROMETRIUM) 100 MG capsule Take 100 mg by mouth daily.     spironolactone (ALDACTONE) 50 MG tablet Take 50 mg by mouth daily.     famotidine (PEPCID) 20 MG tablet Take 1 tablet (20 mg total) by mouth 2 (two) times daily. 30 tablet 6   fluticasone-salmeterol (ADVAIR HFA) 115-21 MCG/ACT inhaler Inhale 2 puffs into the lungs 2 (two) times daily. 1 each 12   No facility-administered medications prior to visit.     Objective:     BP 105/66 (BP Location: Left Arm, Patient Position: Sitting, Cuff Size: Normal)   Pulse 91   Temp 98.2 F (36.8 C) (Oral)   Ht 5' 3.5" (1.613 m)   Wt 142 lb 9.6 oz (64.7 kg)   SpO2 97% Comment: on RA  BMI 24.86 kg/m   SpO2: 97 % (on RA)  Pleasant wf with a very harsh barking quality mostly dry sounding rough with minimal rattle   HEENT : Oropharynx  clear/ no pnds or cobblestoning       Nasal turbinates nl    NECK :  without  apparent JVD/ palpable Nodes/TM    LUNGS: no acc muscle use,  Nl contour chest which is clear to A and P bilaterally without cough on insp or exp maneuvers   CV:  RRR  no s3 or murmur or increase in P2, and no edema   ABD:  soft and nontender with nl inspiratory excursion in the supine position. No bruits or organomegaly appreciated   MS:  Nl gait/ ext warm without deformities Or obvious joint restrictions  calf tenderness, cyanosis or clubbing    SKIN: warm and dry without lesions    NEURO:  alert, approp, nl sensorium with  no motor or cerebellar deficits apparent.   Labs ordered 02/06/2022  :  allergy screen    Assessment   Upper airway cough syndrome Allergy screen  02/06/2022 >  Eos 0.2 /  IgE   - cyclical cough rx with Tyl #3  02/07/2022 >>>   Of the three most common causes of  Sub-acute / recurrent or chronic cough, only one (GERD)  can actually contribute to/ trigger  the other two (asthma and post nasal drip syndrome)  and perpetuate the cylce of cough.  While not intuitively obvious, many patients with chronic low grade reflux do not cough until there is a primary insult that disturbs the protective epithelial barrier and exposes sensitive nerve endings.   This is typically viral (which is likely how this started on a trip) but can sustained due to PNDS and  both likely applied  here.    >>>  The point is that once  this occurs, it is difficult to eliminate the cycle  using anything but a maximally effective acid suppression regimen at least in the short run, accompanied by an appropriate diet to address non acid GERD and control / eliminate the cough itself for at least 3 days with tyl #3 and eliminate pnds with 1st gen H1 blockers per guidelines >>> also stopped advair for now and added 6 day taper off  Prednisone starting at 40 mg per day in case of component of Th-2 driven upper or lower airways inflammation (if cough responds short term only to relapse before return while will on full rx for uacs (as above), then  that would point to allergic rhinitis/ asthma or eos bronchitis as alternative dx) .         Each maintenance medication was reviewed in detail including emphasizing most importantly the difference between maintenance and prns and under what circumstances the prns are to be triggered using an action plan format where appropriate.  Total time for H and P, chart review, counseling, reviewing hfa device(s) and generating customized AVS unique to this office visit / same day charting > 40 min pt new to me with refractory resp symptoms of unknown origin.          Christinia Gully, MD 02/07/2022

## 2022-02-07 ENCOUNTER — Encounter: Payer: Self-pay | Admitting: Internal Medicine

## 2022-02-07 LAB — IGE: IgE (Immunoglobulin E), Serum: 13 kU/L (ref <=114)

## 2022-02-07 NOTE — Assessment & Plan Note (Signed)
Allergy screen 02/06/2022 >  Eos 0.2 /  IgE   - cyclical cough rx with Tyl #3  02/07/2022 >>>   Of the three most common causes of  Sub-acute / recurrent or chronic cough, only one (GERD)  can actually contribute to/ trigger  the other two (asthma and post nasal drip syndrome)  and perpetuate the cylce of cough.  While not intuitively obvious, many patients with chronic low grade reflux do not cough until there is a primary insult that disturbs the protective epithelial barrier and exposes sensitive nerve endings.   This is typically viral (which is likely how this started on a trip) but can sustained due to PNDS and  both likely applied  here.    >>>  The point is that once this occurs, it is difficult to eliminate the cycle  using anything but a maximally effective acid suppression regimen at least in the short run, accompanied by an appropriate diet to address non acid GERD and control / eliminate the cough itself for at least 3 days with tyl #3 and eliminate pnds with 1st gen H1 blockers per guidelines >>> also stopped advair for now and added 6 day taper off  Prednisone starting at 40 mg per day in case of component of Th-2 driven upper or lower airways inflammation (if cough responds short term only to relapse before return while will on full rx for uacs (as above), then  that would point to allergic rhinitis/ asthma or eos bronchitis as alternative dx) .         Each maintenance medication was reviewed in detail including emphasizing most importantly the difference between maintenance and prns and under what circumstances the prns are to be triggered using an action plan format where appropriate.  Total time for H and P, chart review, counseling, reviewing hfa device(s) and generating customized AVS unique to this office visit / same day charting > 40 min pt new to me with refractory resp symptoms of unknown origin.

## 2022-02-08 ENCOUNTER — Telehealth: Payer: Self-pay | Admitting: Internal Medicine

## 2022-02-08 NOTE — Telephone Encounter (Signed)
PT calling for recent lab results. Pls call @ [phone]. TY

## 2022-02-12 ENCOUNTER — Telehealth: Payer: Self-pay | Admitting: Internal Medicine

## 2022-02-12 NOTE — Telephone Encounter (Signed)
Called patient and left voicemail with the results from Dr Melvyn Novas. Told her to call the office back with any questions or concerns. Nothing further needed

## 2022-02-12 NOTE — Telephone Encounter (Signed)
PT still wants blood work results. Pred series completed and still coughing. Pls call. TY

## 2022-02-13 NOTE — Progress Notes (Signed)
Spoke with pt and notified of results per Dr. Melvyn Novas. Pt verbalized understanding.

## 2022-02-13 NOTE — Telephone Encounter (Signed)
I spoke with the pt and gave results of labs  Nothing further needed

## 2022-02-15 ENCOUNTER — Telehealth (INDEPENDENT_AMBULATORY_CARE_PROVIDER_SITE_OTHER): Payer: Medicare Other | Admitting: Pulmonary Disease

## 2022-02-15 ENCOUNTER — Ambulatory Visit: Payer: Medicare Other | Admitting: Physician Assistant

## 2022-02-15 DIAGNOSIS — R053 Chronic cough: Secondary | ICD-10-CM

## 2022-02-15 MED ORDER — BENZONATATE 200 MG PO CAPS: 200.0000 mg | ORAL_CAPSULE | Freq: Three times a day (TID) | ORAL | 1 refills | Status: DC | PRN

## 2022-02-15 MED ORDER — HYDROCODONE BIT-HOMATROP MBR 5-1.5 MG/5ML PO SOLN: 5.0000 mL | Freq: Every evening | ORAL | 0 refills | Status: DC

## 2022-02-15 NOTE — Progress Notes (Addendum)
Virtual Visit via Video Note  I connected with Vanessa Harris on 03/08/22 at  3:15 PM EST by a video enabled telemedicine application and verified that I am speaking with the correct person using two identifiers.  Location: Patient: Home Provider: [facility]   I discussed the limitations of evaluation and management by telemedicine and the availability of in person appointments. The patient expressed understanding and agreed to proceed.    I discussed the assessment and treatment plan with the patient. The patient was provided an opportunity to ask questions and all were answered. The patient agreed with the plan and demonstrated an understanding of the instructions.   The patient was advised to call back or seek an in-person evaluation if the symptoms worsen or if the condition fails to improve as anticipated.   Synopsis: Referred in May 2023 for chronic cough by Reynold Bowen, MD  Subjective:   PATIENT ID: Vanessa Harris GENDER: female DOB: 10-06-1955, MRN: [id number]  HPI  Chief Complaint  Patient presents with   Follow-up    Constant Cough feels like she is also congested     Vanessa Harris is a 66 year old woman, never smoker who returns to pulmonary clinic for chronic cough.   She reports cutting back on alcohol. She is not drinking every day now. She continues to eat dinner late at times. She has cut out ice tea throughout the day and is drinking water. She continues to have a dry cough without much improvement. She has been on omeprazole '20mg'$  BID. She reports she felt pantoprazole '40mg'$  daily gave better benefit when tried before.  Referral to GI has been placed.   CT of the chest does not show any airway or parenchymal abnormalities.   OV 10/31/21 She was referred to ENT for hoarseness in her voice and started on ipratropium nasal spray at last visit. She saw ENT 10/18/21 and flexible laryngoscopy was performed and notable for abundant  pharyngeal mucous consistent with laryngopharyngeal reflux disease. Her vocal cords were unremarkable. Her PPI therapy was increased to twice daily use and she has not been using it regularly in the evenings. The cough is somewhat improved on PPI therapy, but still remains.  She continues to drink alcohol each evening. She is drinking iced tea throughout the day with caffeine.  OV 09/17/21 She reports her cough is less severe and it seemed to improved with taking PPI on empty stomach. She is sleeping better and will occasionally wake up due to the cough. She is not eating 2 hours before bedtime. She has not elevated the head of her bed. She continues to have intermittent hoarseness of her voice.    Initial OV 07/10/21 She reports having cough since January. The cough is dry. She reports irritation in her throat and does not feel the cough is coming from her chest. She denies sinus congestion or post nasal drainage. She denies wheezing or shortness of breath with the cough. She also reports hoarseness in her voice. She denies dysphagia or coughing with eating or drinking. She reports the cough can happen at random times during the day and can cause night time awakenings most nights. She denies overt heart burn symptoms. She was trialed on pantoprazole by her PCP without improvement.   Chest radiograph from 05/29/21 is unremarkable. Patient brought in copy of radiograph on CD for review.   She is a never smoker. She works in [employer] (medical). She drinks 1-2 vodka drinks per night. She  reports eating dinner after 8pm and usually goes to bed around 10pm.   02/15/22 Since her last visit she was started on prednisone with no relief. Cough persisted but now insomniac due to the steroids. She has a hacky cough. No wheezing or shortness of breath. Tried inhaler without improvement. Her cough worsens at night.   Past Medical History:  Diagnosis Date   Hyperlipidemia      Family History  Problem Relation Age of Onset    Non-Hodgkin's lymphoma Mother    Myelodysplastic syndrome Mother    Diabetes Father    Congestive Heart Failure Father      Social History   Socioeconomic History   Marital status: Married    Spouse name: Not on file   Number of children: Not on file   Years of education: Not on file   Highest education level: Not on file  Occupational History   Not on file  Tobacco Use   Smoking status: Never   Smokeless tobacco: Never  Substance and Sexual Activity   Alcohol use: Yes   Drug use: Not on file   Sexual activity: Not on file  Other Topics Concern   Not on file  Social History Narrative   Not on file   Social Determinants of Health   Financial Resource Strain: Not on file  Food Insecurity: Not on file  Transportation Needs: Not on file  Physical Activity: Not on file  Stress: Not on file  Social Connections: Not on file  Intimate Partner Violence: Not on file     No Known Allergies   Outpatient Medications Prior to Visit  Medication Sig Dispense Refill   acetaminophen-codeine (TYLENOL #3) 300-30 MG tablet      buPROPion (WELLBUTRIN SR) 150 MG 12 hr tablet Take 150 mg by mouth 2 (two) times daily.     dextromethorphan (DELSYM) 30 MG/5ML liquid Take by mouth as needed for cough.     estradiol (VIVELLE-DOT) 0.05 MG/24HR patch estradiol 0.05 mg/24 hr semiweekly transdermal patch  Apply 1 patch twice a week by transdermal route for 84 days.     famotidine (PEPCID) 20 MG tablet One after supper 30 tablet 11   ipratropium (ATROVENT) 0.03 % nasal spray Place 2 sprays into both nostrils every 12 (twelve) hours. 30 mL 12   pantoprazole (PROTONIX) 40 MG tablet Take 1 tablet (40 mg total) by mouth daily. 30 tablet 6   progesterone (PROMETRIUM) 100 MG capsule Take 100 mg by mouth daily.     spironolactone (ALDACTONE) 50 MG tablet Take 50 mg by mouth daily.     benzonatate (TESSALON) 200 MG capsule Take 1 capsule (200 mg total) by mouth 3 (three) times daily as needed for  cough. (Patient not taking: Reported on 02/15/2022) 30 capsule 1   predniSONE (DELTASONE) 10 MG tablet Take  4 each am x 2 days,   2 each am x 2 days,  1 each am x 2 days and stop (Patient not taking: Reported on 02/15/2022) 14 tablet 0   No facility-administered medications prior to visit.   Review of Systems  Constitutional:  Negative for chills, diaphoresis, fever, malaise/fatigue and weight loss.  HENT:  Negative for congestion.   Respiratory:  Positive for cough. Negative for hemoptysis, sputum production, shortness of breath and wheezing.   Cardiovascular:  Negative for chest pain, palpitations and leg swelling.    Objective:   There were no vitals filed for this visit.    Physical Exam Physical Exam: General:  Well-appearing, no acute distress HENT: [place], AT Eyes: EOMI, no scleral icterus Respiratory: No respiratory distress Neuro: AAO x4, CNII-XII grossly intact Psych: Normal mood, normal affect  CBC    Component Value Date/Time   WBC 8.6 02/06/2022 1028   RBC 4.02 02/06/2022 1028   HGB 12.6 02/06/2022 1028   HCT 36.8 02/06/2022 1028   PLT 395.0 02/06/2022 1028   MCV 91.7 02/06/2022 1028   MCHC 34.1 02/06/2022 1028   RDW 12.7 02/06/2022 1028   LYMPHSABS 1.5 02/06/2022 1028   MONOABS 0.9 02/06/2022 1028   EOSABS 0.2 02/06/2022 1028   BASOSABS 0.0 02/06/2022 1028   Chest imaging: CT Chest 11/15/21 1. No evidence of interstitial lung disease. 2. Scattered subpleural nodules measure up to 5 mm. No follow-up needed if patient is low-risk (and has no known or suspected primary neoplasm). Non-contrast chest CT can be considered in 12 months if patient is high-risk. This recommendation follows the consensus statement: Guidelines for Management of Incidental Pulmonary Nodules Detected on CT Images: From the Fleischner Society 2017; Radiology 2017; 284:228-243. 3.  Aortic atherosclerosis (ICD10-I70.0).  CXR 05/29/21 Clear lungs bilaterally  PFT:     No data to  display           Labs:  Path:  Echo:  Heart Catheterization:  Assessment & Plan:   No diagnosis found.  Discussion: Kenidi Elenbaas is a 66 year old female never smoker who presents for follow-up for cough  Suspect cough related to GERD and laryngopharyngeal reflux based on ENT evaluation.  Advair - ineffective but took < 1 month Swallow evaluation - normal  Chronic cough --Discussed options for temporary relief for cough during the holiday --Rx Cough syrup with codeine ordered. Cautioned with alcohol intake --Discontinued Tylenol 3 as patient was not comfortable with this option --Rx tessalon perles TID PRN --Continue reflux medication  --Keep GI appointment/evaluation  Rodman Pickle, MD [facility] Office: [phone]    Current Outpatient Medications:    acetaminophen-codeine (TYLENOL #3) 300-30 MG tablet, , Disp: , Rfl:    buPROPion (WELLBUTRIN SR) 150 MG 12 hr tablet, Take 150 mg by mouth 2 (two) times daily., Disp: , Rfl:    dextromethorphan (DELSYM) 30 MG/5ML liquid, Take by mouth as needed for cough., Disp: , Rfl:    estradiol (VIVELLE-DOT) 0.05 MG/24HR patch, estradiol 0.05 mg/24 hr semiweekly transdermal patch  Apply 1 patch twice a week by transdermal route for 84 days., Disp: , Rfl:    famotidine (PEPCID) 20 MG tablet, One after supper, Disp: 30 tablet, Rfl: 11   ipratropium (ATROVENT) 0.03 % nasal spray, Place 2 sprays into both nostrils every 12 (twelve) hours., Disp: 30 mL, Rfl: 12   pantoprazole (PROTONIX) 40 MG tablet, Take 1 tablet (40 mg total) by mouth daily., Disp: 30 tablet, Rfl: 6   progesterone (PROMETRIUM) 100 MG capsule, Take 100 mg by mouth daily., Disp: , Rfl:    spironolactone (ALDACTONE) 50 MG tablet, Take 50 mg by mouth daily., Disp: , Rfl:    benzonatate (TESSALON) 200 MG capsule, Take 1 capsule (200 mg total) by mouth 3 (three) times daily as needed for cough. (Patient not taking: Reported on 02/15/2022), Disp:  30 capsule, Rfl: 1   predniSONE (DELTASONE) 10 MG tablet, Take  4 each am x 2 days,   2 each am x 2 days,  1 each am x 2 days and stop (Patient not taking: Reported on 02/15/2022), Disp: 14 tablet, Rfl: 0

## 2022-02-16 ENCOUNTER — Encounter (HOSPITAL_BASED_OUTPATIENT_CLINIC_OR_DEPARTMENT_OTHER): Payer: Self-pay | Admitting: Pulmonary Disease

## 2022-02-27 ENCOUNTER — Telehealth: Payer: Self-pay | Admitting: Pulmonary Disease

## 2022-02-27 NOTE — Telephone Encounter (Signed)
Advised to hold on the unisom while taking codeine due to drug interaction.  Encouraged her to continue with EGD as recommended by GI.  Scheduled for follow-up with Dr. Erin Fulling this month.

## 2022-02-27 NOTE — Telephone Encounter (Signed)
Spk to patient and wants to know if hycodan can be refilled and if unisom and hycodan cancel each other out

## 2022-02-27 NOTE — Telephone Encounter (Signed)
Patient stated she would like the nurse or doctor Loanne Drilling to call her regarding a video visit she had with the doctor last week.  She stated that they kept getting cut off and she still had a few questions to ask the doctor.  Please call patient to discuss at [phone]

## 2022-03-04 ENCOUNTER — Telehealth: Payer: Self-pay | Admitting: Pulmonary Disease

## 2022-03-04 DIAGNOSIS — R053 Chronic cough: Secondary | ICD-10-CM

## 2022-03-04 NOTE — Telephone Encounter (Signed)
Attempted to call pt but unable to reach. Left pt a detailed message letting her know that we would check with JD to see if he is okay refilling pt's cough med.     Dr. Erin Fulling, please advise if you are okay refilling pt's hycodan sending it to [facility] in [place].

## 2022-03-05 NOTE — Telephone Encounter (Signed)
Ok to refill 

## 2022-03-06 ENCOUNTER — Other Ambulatory Visit: Payer: Self-pay

## 2022-03-06 MED ORDER — HYDROCODONE BIT-HOMATROP MBR 5-1.5 MG/5ML PO SOLN: 5.0000 mL | Freq: Four times a day (QID) | ORAL | 0 refills | Status: DC | PRN

## 2022-03-06 NOTE — Telephone Encounter (Signed)
completed

## 2022-03-06 NOTE — Telephone Encounter (Signed)
Dr. Erin Fulling can you sign the refill for the hycodan cough syrup.

## 2022-03-13 ENCOUNTER — Encounter: Payer: Self-pay | Admitting: Pulmonary Disease

## 2022-03-13 ENCOUNTER — Ambulatory Visit: Payer: Medicare Other | Admitting: Pulmonary Disease

## 2022-03-13 VITALS — BP 116/72 | HR 80 | Ht 63.5 in | Wt 142.0 lb

## 2022-03-13 DIAGNOSIS — R058 Other specified cough: Secondary | ICD-10-CM | POA: Diagnosis not present

## 2022-03-13 DIAGNOSIS — R490 Dysphonia: Secondary | ICD-10-CM | POA: Diagnosis not present

## 2022-03-13 DIAGNOSIS — R053 Chronic cough: Secondary | ICD-10-CM | POA: Diagnosis not present

## 2022-03-13 MED ORDER — CHLORPHENIRAMINE MALEATE 4 MG PO TABS: 4.0000 mg | ORAL_TABLET | Freq: Every day | ORAL | 3 refills | Status: AC

## 2022-03-13 NOTE — Patient Instructions (Addendum)
Use atrovent nasal spray, 2 sprays twice per day  Take chlorpheniramine '4mg'$  1 tablet at bedtime.  Continue hycodan cough syrup as needed at night  Continue famotidine and pantoprazole until further recommendations by your GI team  Continue tesalon perles as needed  Follow up with GI for your EGD  Follow up in 3 months

## 2022-03-13 NOTE — Progress Notes (Signed)
Synopsis: Referred in May 2023 for chronic cough by Reynold Bowen, MD  Subjective:   PATIENT ID: Vanessa Harris GENDER: female DOB: 06-05-1955, MRN: [id number]  HPI  Chief Complaint  Patient presents with   Follow-up    2 mo f/u for cough. States she still has the cough. Feels like she has pulled a muscle on her left side from coughing.    Vanessa Harris is a 67 year old woman, never smoker who returns to pulmonary clinic for chronic cough.   She was seen by Dr. Melvyn Novas 02/06/22 and started on acetaminophen-codeine for cough suppression and steroid taper. She had video visit 02/15/22 with Dr. Loanne Drilling and did not start acetaminophen-codeine due to concerns and did not find relief from prednisone. She was started on hycodan cough syrup for relief of her night time cough symptoms. She reports the hycodan did not help her much and it felt like it kept her up at night. She has not been taking atrovent nasal spray. She is taking unisom to help her sleep at night.   She has GI visit this coming Monday.   OV 01/10/22 She reports cutting back on alcohol. She is not drinking every day now. She continues to eat dinner late at times. She has cut out ice tea throughout the day and is drinking water. She continues to have a dry cough without much improvement. She has been on omeprazole '20mg'$  BID. She reports she felt pantoprazole '40mg'$  daily gave better benefit when tried before.  Referral to GI has been placed.   CT of the chest does not show any airway or parenchymal abnormalities.   OV 10/31/21 She was referred to ENT for hoarseness in her voice and started on ipratropium nasal spray at last visit. She saw ENT 10/18/21 and flexible laryngoscopy was performed and notable for abundant pharyngeal mucous consistent with laryngopharyngeal reflux disease. Her vocal cords were unremarkable. Her PPI therapy was increased to twice daily use and she has not been using it regularly in the evenings. The cough  is somewhat improved on PPI therapy, but still remains.  She continues to drink alcohol each evening. She is drinking iced tea throughout the day with caffeine.  OV 09/17/21 She reports her cough is less severe and it seemed to improved with taking PPI on empty stomach. She is sleeping better and will occasionally wake up due to the cough. She is not eating 2 hours before bedtime. She has not elevated the head of her bed. She continues to have intermittent hoarseness of her voice.    Initial OV 07/10/21 She reports having cough since January. The cough is dry. She reports irritation in her throat and does not feel the cough is coming from her chest. She denies sinus congestion or post nasal drainage. She denies wheezing or shortness of breath with the cough. She also reports hoarseness in her voice. She denies dysphagia or coughing with eating or drinking. She reports the cough can happen at random times during the day and can cause night time awakenings most nights. She denies overt heart burn symptoms. She was trialed on pantoprazole by her PCP without improvement.   Chest radiograph from 05/29/21 is unremarkable. Patient brought in copy of radiograph on CD for review.   She is a never smoker. She works in [employer] (medical). She drinks 1-2 vodka drinks per night. She reports eating dinner after 8pm and usually goes to bed around 10pm.   Past Medical History:  Diagnosis Date  Hyperlipidemia      Family History  Problem Relation Age of Onset   Non-Hodgkin's lymphoma Mother    Myelodysplastic syndrome Mother    Diabetes Father    Congestive Heart Failure Father      Social History   Socioeconomic History   Marital status: Married    Spouse name: Not on file   Number of children: Not on file   Years of education: Not on file   Highest education level: Not on file  Occupational History   Not on file  Tobacco Use   Smoking status: Never   Smokeless tobacco: Never  Substance and Sexual Activity    Alcohol use: Yes   Drug use: Not on file   Sexual activity: Not on file  Other Topics Concern   Not on file  Social History Narrative   Not on file   Social Determinants of Health   Financial Resource Strain: Not on file  Food Insecurity: Not on file  Transportation Needs: Not on file  Physical Activity: Not on file  Stress: Not on file  Social Connections: Not on file  Intimate Partner Violence: Not on file     No Known Allergies   Outpatient Medications Prior to Visit  Medication Sig Dispense Refill   benzonatate (TESSALON) 200 MG capsule Take 1 capsule (200 mg total) by mouth 3 (three) times daily as needed for cough. 30 capsule 1   buPROPion (WELLBUTRIN SR) 150 MG 12 hr tablet Take 150 mg by mouth 2 (two) times daily.     dextromethorphan (DELSYM) 30 MG/5ML liquid Take by mouth as needed for cough.     estradiol (VIVELLE-DOT) 0.05 MG/24HR patch estradiol 0.05 mg/24 hr semiweekly transdermal patch  Apply 1 patch twice a week by transdermal route for 84 days.     famotidine (PEPCID) 20 MG tablet One after supper 30 tablet 11   HYDROcodone bit-homatropine (HYCODAN) 5-1.5 MG/5ML syrup Take 5 mLs by mouth every 6 (six) hours as needed for cough. 240 mL 0   ipratropium (ATROVENT) 0.03 % nasal spray Place 2 sprays into both nostrils every 12 (twelve) hours. 30 mL 12   pantoprazole (PROTONIX) 40 MG tablet Take 1 tablet (40 mg total) by mouth daily. 30 tablet 6   progesterone (PROMETRIUM) 100 MG capsule Take 100 mg by mouth daily.     spironolactone (ALDACTONE) 50 MG tablet Take 50 mg by mouth daily.     HYDROcodone bit-homatropine (HYCODAN) 5-1.5 MG/5ML syrup Take 5 mLs by mouth at bedtime. 120 mL 0   No facility-administered medications prior to visit.   Review of Systems  Constitutional:  Negative for chills, fever, malaise/fatigue and weight loss.  HENT:  Negative for congestion, sinus pain and sore throat.   Eyes: Negative.   Respiratory:  Positive for cough. Negative for  hemoptysis, sputum production, shortness of breath and wheezing.   Cardiovascular:  Negative for chest pain, palpitations, orthopnea, claudication and leg swelling.  Gastrointestinal:  Negative for abdominal pain, heartburn, nausea and vomiting.  Genitourinary: Negative.   Musculoskeletal:  Negative for joint pain and myalgias.  Skin:  Negative for rash.  Neurological:  Negative for weakness.  Endo/Heme/Allergies: Negative.   Psychiatric/Behavioral: Negative.      Objective:   Vitals:   03/13/22 1018  BP: 116/72  Pulse: 80  SpO2: 99%  Weight: 142 lb (64.4 kg)  Height: 5' 3.5" (1.613 m)     Physical Exam Constitutional:      General: She is not in  acute distress.    Appearance: She is not ill-appearing.  HENT:     Head: Normocephalic and atraumatic.     Nose: Nose normal.     Mouth/Throat:     Mouth: Mucous membranes are moist.  Cardiovascular:     Rate and Rhythm: Normal rate and regular rhythm.     Pulses: Normal pulses.     Heart sounds: Normal heart sounds. No murmur heard. Pulmonary:     Effort: Pulmonary effort is normal.     Breath sounds: Normal breath sounds. No wheezing, rhonchi or rales.  Musculoskeletal:     Right lower leg: No edema.     Left lower leg: No edema.  Skin:    General: Skin is warm and dry.  Neurological:     General: No focal deficit present.     Mental Status: She is alert.    CBC    Component Value Date/Time   WBC 8.6 02/06/2022 1028   RBC 4.02 02/06/2022 1028   HGB 12.6 02/06/2022 1028   HCT 36.8 02/06/2022 1028   PLT 395.0 02/06/2022 1028   MCV 91.7 02/06/2022 1028   MCHC 34.1 02/06/2022 1028   RDW 12.7 02/06/2022 1028   LYMPHSABS 1.5 02/06/2022 1028   MONOABS 0.9 02/06/2022 1028   EOSABS 0.2 02/06/2022 1028   BASOSABS 0.0 02/06/2022 1028   Chest imaging: CT Chest 11/15/21 1. No evidence of interstitial lung disease. 2. Scattered subpleural nodules measure up to 5 mm. No follow-up needed if patient is low-risk (and has  no known or suspected primary neoplasm). Non-contrast chest CT can be considered in 12 months if patient is high-risk. This recommendation follows the consensus statement: Guidelines for Management of Incidental Pulmonary Nodules Detected on CT Images: From the Fleischner Society 2017; Radiology 2017; 284:228-243. 3.  Aortic atherosclerosis (ICD10-I70.0).  CXR 05/29/21 Clear lungs bilaterally  PFT:     No data to display          Labs:  Path:  Echo:  Heart Catheterization:  Modified Barium Swallow 01/25/22 Pt presents with normal oropharyngeal swallow function. There is thorough mastication and adequate bolus control. timely onset of the pharyngeal swallow, reliable laryngeal vestibule closure without penetration/aspiration, strong pharyngeal contraction and clearance. After the study we discussed the possible contributions to cough, including LPR. We reviewed LPR and GERD and the inflammation/irritation to esophagus and in particular, the larynx, that can incite cough. We discussed trying a strict low-acid diet for thirty days and she was given a handout specifying some cough suppression techniques she can try. If she needs more structured support, she could be referred to [facility] on [address]. Pt agrees with plan.   Assessment & Plan:   Chronic cough - Plan: chlorpheniramine (CHLOR-TRIMETON) 4 MG tablet  Upper airway cough syndrome  Hoarseness  Discussion: Shalva Rozycki is a 67 year old woman, never smoker who returns to pulmonary clinic for chronic cough.   Her cough appears related to GERD and laryngopharyngeal reflux based on ENT evaluation and likely upper airway cough syndrome driven mainly by chronic throat clearing and post-nasal draiange.   She is to '40mg'$  pantoprazole 30 minutes before breakfast and '20mg'$  famotidine at bedtime. She is being evaluated by GI in the near future. She has reportedly made necessary diet changes and sleeps elevated at night without  change in her cough.   She is to resume ipratropium nasal spray and start chlorpheniramine '4mg'$  at bedtime.   She can continue tesalon perles and hycodan  cough syrup as needed.   She would likely benefit from outpatient speech therapy. Will place this referral once he have more info from her GI evaluation.   Follow up in 3 months.   Freda Jackson, MD [facility] Office: [phone]    Current Outpatient Medications:    benzonatate (TESSALON) 200 MG capsule, Take 1 capsule (200 mg total) by mouth 3 (three) times daily as needed for cough., Disp: 30 capsule, Rfl: 1   buPROPion (WELLBUTRIN SR) 150 MG 12 hr tablet, Take 150 mg by mouth 2 (two) times daily., Disp: , Rfl:    chlorpheniramine (CHLOR-TRIMETON) 4 MG tablet, Take 1 tablet (4 mg total) by mouth at bedtime., Disp: 30 tablet, Rfl: 3   dextromethorphan (DELSYM) 30 MG/5ML liquid, Take by mouth as needed for cough., Disp: , Rfl:    estradiol (VIVELLE-DOT) 0.05 MG/24HR patch, estradiol 0.05 mg/24 hr semiweekly transdermal patch  Apply 1 patch twice a week by transdermal route for 84 days., Disp: , Rfl:    famotidine (PEPCID) 20 MG tablet, One after supper, Disp: 30 tablet, Rfl: 11   HYDROcodone bit-homatropine (HYCODAN) 5-1.5 MG/5ML syrup, Take 5 mLs by mouth every 6 (six) hours as needed for cough., Disp: 240 mL, Rfl: 0   ipratropium (ATROVENT) 0.03 % nasal spray, Place 2 sprays into both nostrils every 12 (twelve) hours., Disp: 30 mL, Rfl: 12   pantoprazole (PROTONIX) 40 MG tablet, Take 1 tablet (40 mg total) by mouth daily., Disp: 30 tablet, Rfl: 6   progesterone (PROMETRIUM) 100 MG capsule, Take 100 mg by mouth daily., Disp: , Rfl:    spironolactone (ALDACTONE) 50 MG tablet, Take 50 mg by mouth daily., Disp: , Rfl:

## 2022-03-17 ENCOUNTER — Encounter: Payer: Self-pay | Admitting: Pulmonary Disease

## 2022-03-18 ENCOUNTER — Ambulatory Visit: Payer: Medicare Other | Admitting: Physician Assistant

## 2022-03-18 ENCOUNTER — Ambulatory Visit: Payer: Medicare Other | Admitting: Internal Medicine

## 2022-03-18 ENCOUNTER — Encounter: Payer: Self-pay | Admitting: Internal Medicine

## 2022-03-18 DIAGNOSIS — R059 Cough, unspecified: Secondary | ICD-10-CM

## 2022-03-18 DIAGNOSIS — R0781 Pleurodynia: Secondary | ICD-10-CM

## 2022-03-18 DIAGNOSIS — K219 Gastro-esophageal reflux disease without esophagitis: Secondary | ICD-10-CM

## 2022-03-18 DIAGNOSIS — Z1211 Encounter for screening for malignant neoplasm of colon: Secondary | ICD-10-CM | POA: Diagnosis not present

## 2022-03-18 MED ORDER — LIDOCAINE 5 % EX PTCH: 1.0000 | MEDICATED_PATCH | CUTANEOUS | 0 refills | Status: AC

## 2022-03-18 NOTE — Patient Instructions (Addendum)
_______________________________________________________  If your blood pressure at your visit was 140/90 or greater, please contact your primary care physician to follow up on this.  _______________________________________________________  If you are age 68 or older, your body mass index should be between 23-30. Your Body mass index is 24.93 kg/m. If this is out of the aforementioned range listed, please consider follow up with your Primary Care Provider. ________________________________________________________  The [facility] providers would like to encourage you to use [facility] to communicate with providers for non-urgent requests or questions.  Due to long hold times on the telephone, sending your provider a message by [facility] may be a faster and more efficient way to get a response.  Please allow 48 business hours for a response.  Please remember that this is for non-urgent requests.  _______________________________________________________  We have sent the following medications to your pharmacy for you to pick up at your convenience: START: use Lidicaine 5% patch over left lower rib as needed  CONTINUE: pantoprazole '40mg'$  one tablet daily CONTINUE: famotidine '20mg'$  one tablet every night at bedtime  You have been scheduled for an endoscopy and colonoscopy. Please follow the written instructions given to you at your visit today. Please pick up your prep supplies at the pharmacy within the next 1-3 days. If you use inhalers (even only as needed), please bring them with you on the day of your procedure.  Due to recent changes in healthcare laws, you may see the results of your imaging and laboratory studies on MyChart before your provider has had a chance to review them.  We understand that in some cases there may be results that are confusing or concerning to you. Not all laboratory results come back in the same time frame and the provider may be waiting for multiple results in order to  interpret others.  Please give [place] 48 hours in order for your provider to thoroughly review all the results before contacting the office for clarification of your results.   Thank you for entrusting me with your care and choosing [facility].  Dr Lorenso Courier

## 2022-03-18 NOTE — Progress Notes (Signed)
Chief Complaint: GERD, cough  HPI : 67 year old female with history of chronic cough presents with cough and GERD.  She has had cough for the last year. The cough has been stable in severity. She was told that she had acid reflux so she tried to adjust her diet to be more reflux friendly. She has avoided alcohol and coffee. She also has some some hoarseness due to coughing. Denies dysphagia or odynophagia. Denies N&V. She saw ENT in 09/2021 and had a flexible laryngoscopy performed that was notable for abundant pharyngeal mucous that was consistent with laryngopharyngeal reflux disease. She has been following with pulmonology for her cough issues as well. She was started on pantoprazole 40 mg QD and Pepcid nightly, which may have helped a little. She does feel like her night time symptoms of cough have improved slightly. She is using cough suppressants. Endorses epigastric ab discomfort, which she has attributed to coughing in the past. Denies hematochezia or melena. Denies diarrhea or constipation. Denies weight loss. Denies prior EGD. She had a colonoscopy at least several years ago with Dr. Collene Mares, but she does not recall when is was done exactly. She does not think that she had any colon polyps at that time. Grandfather had stomach cancer. Denies other GI cancers.  Wt Readings from Last 3 Encounters:  03/18/22 143 lb (64.9 kg)  03/13/22 142 lb (64.4 kg)  02/06/22 142 lb 9.6 oz (64.7 kg)   Past Medical History:  Diagnosis Date   Hyperlipidemia    Past Surgical History:  Procedure Laterality Date   INGUINAL HERNIA REPAIR Left    Family History  Problem Relation Age of Onset   Non-Hodgkin's lymphoma Mother    Myelodysplastic syndrome Mother    Diabetes Father    Congestive Heart Failure Father    Colon cancer Neg Hx    Esophageal cancer Neg Hx    Social History   Tobacco Use   Smoking status: Never   Smokeless tobacco: Never  Vaping Use   Vaping Use: Never used  Substance Use  Topics   Alcohol use: Yes    Comment: social   Drug use: Never   Current Outpatient Medications  Medication Sig Dispense Refill   benzonatate (TESSALON) 200 MG capsule Take 1 capsule (200 mg total) by mouth 3 (three) times daily as needed for cough. 30 capsule 1   buPROPion (WELLBUTRIN SR) 150 MG 12 hr tablet Take 150 mg by mouth 2 (two) times daily.     chlorpheniramine (CHLOR-TRIMETON) 4 MG tablet Take 1 tablet (4 mg total) by mouth at bedtime. 30 tablet 3   dextromethorphan (DELSYM) 30 MG/5ML liquid Take by mouth as needed for cough.     estradiol (VIVELLE-DOT) 0.05 MG/24HR patch estradiol 0.05 mg/24 hr semiweekly transdermal patch  Apply 1 patch twice a week by transdermal route for 84 days.     famotidine (PEPCID) 20 MG tablet One after supper 30 tablet 11   HYDROcodone bit-homatropine (HYCODAN) 5-1.5 MG/5ML syrup Take 5 mLs by mouth every 6 (six) hours as needed for cough. 240 mL 0   ipratropium (ATROVENT) 0.03 % nasal spray Place 2 sprays into both nostrils every 12 (twelve) hours. 30 mL 12   pantoprazole (PROTONIX) 40 MG tablet Take 1 tablet (40 mg total) by mouth daily. 30 tablet 6   progesterone (PROMETRIUM) 100 MG capsule Take 100 mg by mouth daily.     spironolactone (ALDACTONE) 50 MG tablet Take 50 mg by mouth daily.  No current facility-administered medications for this visit.   No Known Allergies  Review of Systems: All systems reviewed and negative except where noted in HPI.   Physical Exam: Pulse 82   Ht 5' 3.5" (1.613 m)   Wt 143 lb (64.9 kg)   BMI 24.93 kg/m  Constitutional: Pleasant,well-developed, female in no acute distress. HEENT: Normocephalic and atraumatic. Conjunctivae are normal. No scleral icterus. Cardiovascular: Normal rate, regular rhythm.  Pulmonary/chest: Effort normal and breath sounds normal. No wheezing, rales or rhonchi. Tender over the left lower ribs Abdominal: Soft, nondistended, nontender. Bowel sounds active throughout. There are no  masses palpable. No hepatomegaly. Extremities: No edema Neurological: Alert and oriented to person place and time. Skin: Skin is warm and dry. No rashes noted. Psychiatric: Normal mood and affect. Behavior is normal.  Labs 01/2022: CBC nml.   Chest CT 11/15/21: IMPRESSION: 1. No evidence of interstitial lung disease. 2. Scattered subpleural nodules measure up to 5 mm. No follow-up needed if patient is low-risk (and has no known or suspected primary neoplasm). Non-contrast chest CT can be considered in 12 months if patient is high-risk. This recommendation follows the consensus statement: Guidelines for Management of Incidental Pulmonary Nodules Detected on CT Images: From the Fleischner Society 2017; Radiology 2017; 284:228-243. 3.  Aortic atherosclerosis (ICD10-I70.0)  Modified barium swallow 01/25/22: IMPRESSION: Normal modified barium swallow.  ASSESSMENT AND PLAN: Chronic cough GERD Left rib pain Colon cancer screening Patient presents with chronic cough that has at least been partially attributed to GERD based upon LPR found on her laryngoscopy in 2023. Although she has been following a GERD friendly diet and taking daily PPI/nightly H2 blocker, her cough has still persisted. Will plan for an EGD for further evaluation of her cough and GERD. Can consider escalating her reflux therapies in the future. She was found to be tender over her left lower rib, which is likely due to muscle strain from coughing. I recommended that she try a lidocaine patch over that area for symptom management. Patient may be due for colon cancer screening so will add on a colonoscopy at the same time as her EGD. Patient plans to call her health insurance to make sure that the colonoscopy will be covered. - GERD handout - Cont pantoprazole 40 mg QD and famotidine 20 mg QHS - Lidocaine patch 5% over the left lower rib - EGD/colonoscopy [facility]  I spent 48 minutes of time, including in depth chart review,  independent review of results as outlined above, communicating results with the patient directly, face-to-face time with the patient, coordinating care, ordering studies and medications as appropriate, and documentation.    Christia Reading, MD

## 2022-03-19 ENCOUNTER — Other Ambulatory Visit: Payer: Self-pay | Admitting: Internal Medicine

## 2022-04-05 ENCOUNTER — Ambulatory Visit: Payer: Medicare Other | Admitting: Physician Assistant

## 2022-04-12 ENCOUNTER — Other Ambulatory Visit: Payer: Self-pay | Admitting: Internal Medicine

## 2022-04-12 NOTE — Telephone Encounter (Signed)
Please advise on refill request

## 2022-04-19 ENCOUNTER — Telehealth: Payer: Self-pay | Admitting: Pulmonary Disease

## 2022-04-19 DIAGNOSIS — R053 Chronic cough: Secondary | ICD-10-CM

## 2022-04-19 MED ORDER — HYDROCODONE BIT-HOMATROP MBR 5-1.5 MG/5ML PO SOLN: 5.0000 mL | Freq: Four times a day (QID) | ORAL | 0 refills | Status: DC | PRN

## 2022-04-19 NOTE — Telephone Encounter (Signed)
Called and spoke with pt who is requesting a refill of her hycodan cough syrup. Pt is on her way to [facility] and wants to make sure she has some in case she needs it while [place] the beach.  Pt is requesting to have Rx sent to [facility] at [facility] (pharmacy set). With Dr. Erin Fulling not available this afternoon and since pt had a video visit with Dr. Loanne Drilling one prior time, she is requesting her to refill med. Dr. Loanne Drilling, please advise.

## 2022-04-19 NOTE — Telephone Encounter (Signed)
Hycodan sent in.

## 2022-04-19 NOTE — Telephone Encounter (Signed)
  Pt. Calling in to ask for  Dr. Erin Fulling can you sign the refill for the hycodan cough syrup.     Pt. Asking last seen with Dr. Melvyn Novas she was using a med called Atetaminophen/Codine and wants advise because has a cough again and wants sent to a [facility] at [address] [phone]

## 2022-05-09 ENCOUNTER — Encounter: Payer: Self-pay | Admitting: Internal Medicine

## 2022-05-17 ENCOUNTER — Encounter: Payer: Self-pay | Admitting: Internal Medicine

## 2022-05-17 ENCOUNTER — Ambulatory Visit (AMBULATORY_SURGERY_CENTER): Payer: Medicare Other | Admitting: Internal Medicine

## 2022-05-17 VITALS — BP 111/67 | HR 80 | Temp 98.0°F | Resp 17 | Ht 63.0 in | Wt 143.0 lb

## 2022-05-17 DIAGNOSIS — K2950 Unspecified chronic gastritis without bleeding: Secondary | ICD-10-CM

## 2022-05-17 DIAGNOSIS — Z1211 Encounter for screening for malignant neoplasm of colon: Secondary | ICD-10-CM | POA: Diagnosis not present

## 2022-05-17 DIAGNOSIS — K2951 Unspecified chronic gastritis with bleeding: Secondary | ICD-10-CM | POA: Diagnosis not present

## 2022-05-17 DIAGNOSIS — K2900 Acute gastritis without bleeding: Secondary | ICD-10-CM

## 2022-05-17 DIAGNOSIS — K449 Diaphragmatic hernia without obstruction or gangrene: Secondary | ICD-10-CM

## 2022-05-17 DIAGNOSIS — R059 Cough, unspecified: Secondary | ICD-10-CM

## 2022-05-17 DIAGNOSIS — K222 Esophageal obstruction: Secondary | ICD-10-CM

## 2022-05-17 MED ORDER — NA SULFATE-K SULFATE-MG SULF 17.5-3.13-1.6 GM/177ML PO SOLN: 1.0000 | Freq: Once | ORAL | 0 refills | Status: AC

## 2022-05-17 MED ORDER — ONDANSETRON HCL 4 MG PO TABS: 4.0000 mg | ORAL_TABLET | Freq: Three times a day (TID) | ORAL | 0 refills | Status: AC | PRN

## 2022-05-17 MED ORDER — SODIUM CHLORIDE 0.9 % IV SOLN: 500.0000 mL | Freq: Once | INTRAVENOUS | Status: AC

## 2022-05-17 NOTE — Progress Notes (Signed)
Vss nad trans to pacu Colonscopy aborted due to extremely poor prep

## 2022-05-17 NOTE — Op Note (Signed)
[facility] Patient Name: Vanessa Harris Procedure Date: 05/17/2022 10:44 AM MRN: [id number] Endoscopist: Adline Mango [place] , , [id number] Age: 67 Referring MD:  Date of Birth: [birth year] Gender: Female Account #: [id number] Procedure:                Colonoscopy Indications:              Screening for colorectal malignant neoplasm Medicines:                Monitored Anesthesia Care Procedure:                Pre-Anesthesia Assessment:                           - Prior to the procedure, a History and Physical                            was performed, and patient medications and                            allergies were reviewed. The patient's tolerance of                            previous anesthesia was also reviewed. The risks                            and benefits of the procedure and the sedation                            options and risks were discussed with the patient.                            All questions were answered, and informed consent                            was obtained. Prior Anticoagulants: The patient has                            taken no anticoagulant or antiplatelet agents. ASA                            Grade Assessment: II - A patient with mild systemic                            disease. After reviewing the risks and benefits,                            the patient was deemed in satisfactory condition to                            undergo the procedure.                           After obtaining informed consent, the colonoscope  was passed under direct vision. Throughout the                            procedure, the patient's blood pressure, pulse, and                            oxygen saturations were monitored continuously. The                            Olympus [id number] Colonoscope was                            introduced through the anus and advanced to the the                            cecum,  identified by its appearance. The                            colonoscopy was performed without difficulty. The                            patient tolerated the procedure well. The quality                            of the bowel preparation was poor. The rectum was                            photographed. Scope In: 11:17:26 AM Scope Out: 11:39:19 AM Total Procedure Duration: 0 hours 21 minutes 53 seconds  Findings:                 Copious quantities of semi-solid stool was found in                            the entire colon, making visualization difficult. Complications:            No immediate complications. Estimated Blood Loss:     Estimated blood loss: none. Impression:               - Preparation of the colon was poor.                           - Stool in the entire examined colon.                           - No specimens collected. Recommendation:           - Discharge patient to home (with escort).                           - Repeat colonoscopy at appointment to be scheduled                            with two day bowel preparation because the bowel  preparation was poor. Patient would benefit from                            taking Zofran prior to starting the prep.                           - The findings and recommendations were discussed                            with the patient. Dr Georgian Co "[place]" [place],  05/17/2022 11:46:44 AM

## 2022-05-17 NOTE — Progress Notes (Signed)
Called to room to assist during endoscopic procedure.  Patient ID and intended procedure confirmed with present staff. Received instructions for my participation in the procedure from the performing physician.  

## 2022-05-17 NOTE — Patient Instructions (Addendum)
Take your protonix as ordered. You have six refills from the previous script.  We will schedule another colonoscopy for you with a 2 day prep.  YOU HAD AN ENDOSCOPIC PROCEDURE TODAY AT [facility]:   Refer to the procedure report that was given to you for any specific questions about what was found during the examination.  If the procedure report does not answer your questions, please call your gastroenterologist to clarify.  If you requested that your care partner not be given the details of your procedure findings, then the procedure report has been included in a sealed envelope for you to review at your convenience later.  YOU SHOULD EXPECT: Some feelings of bloating in the abdomen. Passage of more gas than usual.  Walking can help get rid of the air that was put into your GI tract during the procedure and reduce the bloating. If you had a lower endoscopy (such as a colonoscopy or flexible sigmoidoscopy) you may notice spotting of blood in your stool or on the toilet paper. If you underwent a bowel prep for your procedure, you may not have a normal bowel movement for a few days.  Please Note:  You might notice some irritation and congestion in your nose or some drainage.  This is from the oxygen used during your procedure.  There is no need for concern and it should clear up in a day or so.  SYMPTOMS TO REPORT IMMEDIATELY:  Following lower endoscopy (colonoscopy or flexible sigmoidoscopy):  Excessive amounts of blood in the stool  Significant tenderness or worsening of abdominal pains  Swelling of the abdomen that is new, acute  Fever of 100F or higher  Following upper endoscopy (EGD)  Vomiting of blood or coffee ground material  New chest pain or pain under the shoulder blades  Painful or persistently difficult swallowing  New shortness of breath  Fever of 100F or higher  Black, tarry-looking stools  For urgent or emergent issues, a gastroenterologist can be reached at  any hour by calling [phone]. Do not use MyChart messaging for urgent concerns.    DIET:  We do recommend a small meal at first, but then you may proceed to your regular diet.  Drink plenty of fluids but you should avoid alcoholic beverages for 24 hours.  ACTIVITY:  You should plan to take it easy for the rest of today and you should NOT DRIVE or use heavy machinery until tomorrow (because of the sedation medicines used during the test).    FOLLOW UP: Our staff will call the number listed on your records the next business day following your procedure.  We will call around 7:15- 8:00 am to check on you and address any questions or concerns that you may have regarding the information given to you following your procedure. If we do not reach you, we will leave a message.     If any biopsies were taken you will be contacted by phone or by letter within the next 1-3 weeks.  Please call us at [phone] if you have not heard about the biopsies in 3 weeks.    SIGNATURES/CONFIDENTIALITY: You and/or your care partner have signed paperwork which will be entered into your electronic medical record.  These signatures attest to the fact that that the information above on your After Visit Summary has been reviewed and is understood.  Full responsibility of the confidentiality of this discharge information lies with you and/or your care-partner.

## 2022-05-17 NOTE — Progress Notes (Signed)
Pt's states no medical or surgical changes since previsit or office visit. 

## 2022-05-17 NOTE — Op Note (Signed)
[facility] Patient Name: Vanessa Harris Procedure Date: 05/17/2022 10:55 AM MRN: [id number] Endoscopist: Adline Mango [place] , , [id number] Age: 67 Referring MD:  Date of Birth: 1955-11-08 Gender: Female Account #: [id number] Procedure:                Upper GI endoscopy Indications:              Heartburn, Chronic cough Medicines:                Monitored Anesthesia Care Procedure:                Pre-Anesthesia Assessment:                           - Prior to the procedure, a History and Physical                            was performed, and patient medications and                            allergies were reviewed. The patient's tolerance of                            previous anesthesia was also reviewed. The risks                            and benefits of the procedure and the sedation                            options and risks were discussed with the patient.                            All questions were answered, and informed consent                            was obtained. Prior Anticoagulants: The patient has                            taken no anticoagulant or antiplatelet agents. ASA                            Grade Assessment: II - A patient with mild systemic                            disease. After reviewing the risks and benefits,                            the patient was deemed in satisfactory condition to                            undergo the procedure.                           After obtaining informed consent, the endoscope was  passed under direct vision. Throughout the                            procedure, the patient's blood pressure, pulse, and                            oxygen saturations were monitored continuously. The                            Olympus Scope [id number] was introduced through the                            mouth, and advanced to the second part of duodenum.                            The upper GI  endoscopy was accomplished without                            difficulty. The patient tolerated the procedure                            well. Scope In: Scope Out: Findings:                 Biopsies were taken with a cold forceps in the                            proximal esophagus and in the distal esophagus for                            histology.                           A non-obstructing Schatzki ring was found at the                            gastroesophageal junction.                           A hiatal hernia was present.                           Localized inflammation characterized by congestion                            (edema), erythema and nodularity was found in the                            gastric antrum. Biopsies were taken with a cold                            forceps for histology.                           The examined duodenum was normal. Complications:  No immediate complications. Estimated Blood Loss:     Estimated blood loss was minimal. Impression:               - Non-obstructing Schatzki ring.                           - Hiatal hernia.                           - Gastritis. Biopsied.                           - Normal examined duodenum.                           - Biopsies were taken with a cold forceps for                            histology in the proximal esophagus and in the                            distal esophagus. Recommendation:           - Await pathology results.                           - Use Protonix (pantoprazole) 40 mg PO BID as a                            trial for 8 weeks, then decrease to QD.                           - Perform a colonoscopy today. Dr Georgian Co "[place]" [place],  05/17/2022 11:43:12 AM

## 2022-05-17 NOTE — Progress Notes (Signed)
GASTROENTEROLOGY PROCEDURE H&P NOTE   Primary Care Physician: Reynold Bowen, MD    Reason for Procedure:   Chronic cough, GERD, colon cancer screening  Plan:    EGD/colonoscopy  Patient is appropriate for endoscopic procedure(s) in the ambulatory ([facility]) setting.  The nature of the procedure, as well as the risks, benefits, and alternatives were carefully and thoroughly reviewed with the patient. Ample time for discussion and questions allowed. The patient understood, was satisfied, and agreed to proceed.     HPI: Vanessa Harris is a 67 y.o. female who presents for EGD/colonoscopy for evaluation of chronic cough, GERD< and colon cancer screening.  Patient was most recently seen in the Gastroenterology Clinic on 03/18/22.  No interval change in medical history since that appointment. Please refer to that note for full details regarding GI history and clinical presentation.   Past Medical History:  Diagnosis Date   Hyperlipidemia     Past Surgical History:  Procedure Laterality Date   INGUINAL HERNIA REPAIR Left     Prior to Admission medications   Medication Sig Start Date End Date Taking? Authorizing Provider  benzonatate (TESSALON) 200 MG capsule Take 1 capsule (200 mg total) by mouth 3 (three) times daily as needed for cough. 02/15/22   Margaretha Seeds, MD  buPROPion [facility] SR) 150 MG 12 hr tablet Take 150 mg by mouth 2 (two) times daily.    [provider]  chlorpheniramine (CHLOR-TRIMETON) 4 MG tablet Take 1 tablet (4 mg total) by mouth at bedtime. 03/13/22   Freddi Starr, MD  dextromethorphan (DELSYM) 30 MG/5ML liquid Take by mouth as needed for cough.    [provider]  estradiol (VIVELLE-DOT) 0.05 MG/24HR patch estradiol 0.05 mg/24 hr semiweekly transdermal patch  Apply 1 patch twice a week by transdermal route for 84 days.    [provider]  famotidine (PEPCID) 20 MG tablet One after supper 02/06/22   Tanda Rockers, MD   HYDROcodone bit-homatropine (HYCODAN) 5-1.5 MG/5ML syrup Take 5 mLs by mouth every 6 (six) hours as needed for cough. 04/19/22   Margaretha Seeds, MD  ipratropium (ATROVENT) 0.03 % nasal spray Place 2 sprays into both nostrils every 12 (twelve) hours. 09/17/21   Freddi Starr, MD  lidocaine (LIDODERM) 5 % Place 1 patch onto the skin daily. Remove & Discard patch within 24 hours as needed. 03/18/22   Sharyn Creamer, MD  pantoprazole (PROTONIX) 40 MG tablet Take 1 tablet (40 mg total) by mouth daily. 01/10/22   Freddi Starr, MD  progesterone (PROMETRIUM) 100 MG capsule Take 100 mg by mouth daily.    [provider]  spironolactone (ALDACTONE) 50 MG tablet Take 50 mg by mouth daily.    [provider]    Current Outpatient Medications  Medication Sig Dispense Refill   benzonatate (TESSALON) 200 MG capsule Take 1 capsule (200 mg total) by mouth 3 (three) times daily as needed for cough. 30 capsule 1   buPROPion (WELLBUTRIN SR) 150 MG 12 hr tablet Take 150 mg by mouth 2 (two) times daily.     Na Sulfate-K Sulfate-Mg Sulf 17.5-3.13-1.6 GM/177ML SOLN Take 1 Container by mouth once for 1 dose. 354 mL 0   ondansetron (ZOFRAN) 4 MG tablet Take 1 tablet (4 mg total) by mouth every 8 (eight) hours as needed for up to 3 doses for nausea or vomiting. 3 tablet 0   pantoprazole (PROTONIX) 40 MG tablet Take 1 tablet (40 mg total) by  mouth daily. 30 tablet 6   progesterone (PROMETRIUM) 100 MG capsule Take 100 mg by mouth daily.     spironolactone (ALDACTONE) 50 MG tablet Take 50 mg by mouth daily.     chlorpheniramine (CHLOR-TRIMETON) 4 MG tablet Take 1 tablet (4 mg total) by mouth at bedtime. 30 tablet 3   dextromethorphan (DELSYM) 30 MG/5ML liquid Take by mouth as needed for cough.     estradiol (VIVELLE-DOT) 0.05 MG/24HR patch estradiol 0.05 mg/24 hr semiweekly transdermal patch  Apply 1 patch twice a week by transdermal route for 84 days.     famotidine (PEPCID) 20 MG tablet One  after supper 30 tablet 11   HYDROcodone bit-homatropine (HYCODAN) 5-1.5 MG/5ML syrup Take 5 mLs by mouth every 6 (six) hours as needed for cough. 240 mL 0   ipratropium (ATROVENT) 0.03 % nasal spray Place 2 sprays into both nostrils every 12 (twelve) hours. 30 mL 12   lidocaine (LIDODERM) 5 % Place 1 patch onto the skin daily. Remove & Discard patch within 24 hours as needed. 30 patch 0   Current Facility-Administered Medications  Medication Dose Route Frequency Provider Last Rate Last Admin   0.9 %  sodium chloride infusion  500 mL Intravenous Once Sharyn Creamer, MD        Allergies as of 05/17/2022   (No Known Allergies)    Family History  Problem Relation Age of Onset   Non-Hodgkin's lymphoma Mother    Myelodysplastic syndrome Mother    Diabetes Father    Congestive Heart Failure Father    Colon cancer Neg Hx    Esophageal cancer Neg Hx     Social History   Socioeconomic History   Marital status: Married    Spouse name: Not on file   Number of children: 3   Years of education: Not on file   Highest education level: Not on file  Occupational History   Occupation: retail, and self employed  Tobacco Use   Smoking status: Never   Smokeless tobacco: Never  Vaping Use   Vaping Use: Never used  Substance and Sexual Activity   Alcohol use: Yes    Comment: social   Drug use: Never   Sexual activity: Not on file  Other Topics Concern   Not on file  Social History Narrative   Not on file   Social Determinants of Health   Financial Resource Strain: Not on file  Food Insecurity: Not on file  Transportation Needs: Not on file  Physical Activity: Not on file  Stress: Not on file  Social Connections: Not on file  Intimate Partner Violence: Not on file    Physical Exam: Vital signs in last 24 hours: BP 111/67   Pulse 80   Temp 98 F (36.7 C)   Resp 17   Ht 5\' 3"  (1.6 m)   Wt 143 lb (64.9 kg)   SpO2 98%   BMI 25.33 kg/m  GEN: NAD EYE: Sclerae anicteric ENT:  MMM CV: Non-tachycardic Pulm: No increased WOB GI: Soft NEURO:  Alert & Oriented   Christia Reading, MD [facility]   05/17/2022 2:04 PM

## 2022-05-17 NOTE — Progress Notes (Signed)
Patient was not cleaned out for the colonoscopy.  About 5 minutes after coming to recovery , she stated that she had abd pain about #6.  Levsin given at this time.  Patient does have a lot of stool in her colon.  Dr is aware, and thinks that it might be gas.  Pt turned from sisde to side.  Dierdre Highman, RN took over at 8 noon for recovery period.

## 2022-05-20 ENCOUNTER — Telehealth: Payer: Self-pay

## 2022-05-20 NOTE — Telephone Encounter (Signed)
  Follow up Call-     05/17/2022   10:42 AM  Call back number  Post procedure Call Back phone  # [phone]  Permission to leave phone message Yes     Patient questions:  Do you have a fever, pain , or abdominal swelling? No. Pain Score  0 *  Have you tolerated food without any problems? Yes.    Have you been able to return to your normal activities? Yes.    Do you have any questions about your discharge instructions: Diet   No. Medications  No. Follow up visit  No.  Do you have questions or concerns about your Care? No.  Actions: * If pain score is 4 or above: No action needed, pain <4.

## 2022-05-22 ENCOUNTER — Encounter: Payer: Self-pay | Admitting: Internal Medicine

## 2022-05-31 ENCOUNTER — Telehealth: Payer: Self-pay | Admitting: Pulmonary Disease

## 2022-05-31 DIAGNOSIS — R053 Chronic cough: Secondary | ICD-10-CM

## 2022-05-31 MED ORDER — HYDROCODONE BIT-HOMATROP MBR 5-1.5 MG/5ML PO SOLN: 5.0000 mL | Freq: Four times a day (QID) | ORAL | 0 refills | Status: DC | PRN

## 2022-05-31 NOTE — Telephone Encounter (Signed)
Spoke with the pt  She is asking for refill on hycodan  She takes it only at bedtime  Her cough is unchanged from last time she was seen here  It's non prod  She has had endo and seeing GI for fu 05/17/22  She is scheduled with Dr Francine Graven for 07/26/22- first available she could come in   Please advise on refill, thanks!  #240 ml on 04/19/22 lat rx that was sent

## 2022-05-31 NOTE — Telephone Encounter (Signed)
P{t needs a refill for her HYDROcodone bit-homatropine (HYCODAN) 5-1.5 MG/5ML syrup [[id number]]    Pharmacy: [facility] in [place] [phone]

## 2022-05-31 NOTE — Telephone Encounter (Signed)
Refill sent.

## 2022-06-03 NOTE — Telephone Encounter (Signed)
Advised pt on Hycodan cough syrup. She advises she has been able to pick that up. NFN

## 2022-06-17 ENCOUNTER — Ambulatory Visit (AMBULATORY_SURGERY_CENTER): Payer: Medicare Other | Admitting: *Deleted

## 2022-06-17 ENCOUNTER — Telehealth: Payer: Self-pay | Admitting: *Deleted

## 2022-06-17 VITALS — Ht 63.5 in

## 2022-06-17 DIAGNOSIS — Z1211 Encounter for screening for malignant neoplasm of colon: Secondary | ICD-10-CM

## 2022-06-17 NOTE — Telephone Encounter (Signed)
Called pt.to do pre-visit, she decided she was not ready to do procedure she will call back and reschedule when ready,call back number provided to pt.procedure for 07/17/22,canceled per pt. Request.

## 2022-06-17 NOTE — Progress Notes (Signed)
Pt. Was not ready to do procedure,she will call back when ready,call back # provided procedure canceled for 07/17/22.

## 2022-06-20 ENCOUNTER — Encounter: Payer: Medicare Other | Admitting: Internal Medicine

## 2022-07-17 ENCOUNTER — Encounter: Payer: Medicare Other | Admitting: Internal Medicine

## 2022-07-18 ENCOUNTER — Telehealth: Payer: Self-pay | Admitting: Pulmonary Disease

## 2022-07-18 DIAGNOSIS — R053 Chronic cough: Secondary | ICD-10-CM

## 2022-07-18 NOTE — Telephone Encounter (Signed)
Patient would like the nurse to call regarding a medication that has to be approved by the doctor.  Please advise and call to discuss further.  CB# [phone]

## 2022-07-19 MED ORDER — HYDROCODONE BIT-HOMATROP MBR 5-1.5 MG/5ML PO SOLN: 5.0000 mL | Freq: Four times a day (QID) | ORAL | 0 refills | Status: DC | PRN

## 2022-07-19 NOTE — Telephone Encounter (Signed)
Called and spoke with pt who states she is needing a refill of the hycodan cough syrup. Pt said that this is the only thing that helps her sleep at night to help with her cough. Pt said she is out of the medication.  States the pharmacy this needs to go to is [facility] in [place].  Dr. Francine Graven, please advise on this for pt.

## 2022-07-19 NOTE — Telephone Encounter (Signed)
Script sent to her pharmacy

## 2022-07-19 NOTE — Telephone Encounter (Signed)
ATC X1 LVM for patient to call the office back. Please advise hycodan cough syrup has been sent to pharmacy

## 2022-07-26 ENCOUNTER — Ambulatory Visit: Payer: Medicare Other | Admitting: Pulmonary Disease

## 2022-07-26 ENCOUNTER — Encounter: Payer: Self-pay | Admitting: Pulmonary Disease

## 2022-07-26 VITALS — BP 116/72 | HR 77 | Ht 63.5 in | Wt 143.0 lb

## 2022-07-26 DIAGNOSIS — K449 Diaphragmatic hernia without obstruction or gangrene: Secondary | ICD-10-CM

## 2022-07-26 DIAGNOSIS — J018 Other acute sinusitis: Secondary | ICD-10-CM | POA: Diagnosis not present

## 2022-07-26 DIAGNOSIS — K219 Gastro-esophageal reflux disease without esophagitis: Secondary | ICD-10-CM

## 2022-07-26 DIAGNOSIS — R053 Chronic cough: Secondary | ICD-10-CM | POA: Diagnosis not present

## 2022-07-26 MED ORDER — PREDNISONE 10 MG PO TABS
20.0000 mg | ORAL_TABLET | Freq: Every day | ORAL | 0 refills | Status: DC
Start: 2022-07-26 — End: 2022-09-02

## 2022-07-26 MED ORDER — PANTOPRAZOLE SODIUM 40 MG PO TBEC: 40.0000 mg | DELAYED_RELEASE_TABLET | Freq: Two times a day (BID) | ORAL | 1 refills | Status: DC

## 2022-07-26 NOTE — Patient Instructions (Addendum)
Take pantoprazole 40mg  twice daily for 2 months, then return to daily  Take famotidine 20mg  at bedtime  Continue hycodan as needed for cough at night  Start prednisone 20mg  daily for 10 days for sinusitis  Follow up in 3 months with PFTs

## 2022-07-26 NOTE — Progress Notes (Unsigned)
Synopsis: Referred in May 2023 for chronic cough by Adrian Prince, MD  Subjective:   PATIENT ID: Vanessa Harris GENDER: female DOB: [birth year], MRN: [id number]  HPI  Chief Complaint  Patient presents with   Follow-up    F/U on cough. States she believes she has a sinus infection. Increased nasal and chest congestion for the past 2-3 days.    Taleiah Alano is a 67 year old woman, never smoker who returns to pulmonary clinic for chronic cough.   T3 helped with her cough   OV 03/13/22 She was seen by Dr. Sherene Sires 02/06/22 and started on acetaminophen-codeine for cough suppression and steroid taper. She had video visit 02/15/22 with Dr. Everardo All and did not start acetaminophen-codeine due to concerns and did not find relief from prednisone. She was started on hycodan cough syrup for relief of her night time cough symptoms. She reports the hycodan did not help her much and it felt like it kept her up at night. She has not been taking atrovent nasal spray. She is taking unisom to help her sleep at night.   She has GI visit this coming Monday.   OV 01/10/22 She reports cutting back on alcohol. She is not drinking every day now. She continues to eat dinner late at times. She has cut out ice tea throughout the day and is drinking water. She continues to have a dry cough without much improvement. She has been on omeprazole 20mg  BID. She reports she felt pantoprazole 40mg  daily gave better benefit when tried before.  Referral to GI has been placed.   CT of the chest does not show any airway or parenchymal abnormalities.   OV 10/31/21 She was referred to ENT for hoarseness in her voice and started on ipratropium nasal spray at last visit. She saw ENT 10/18/21 and flexible laryngoscopy was performed and notable for abundant pharyngeal mucous consistent with laryngopharyngeal reflux disease. Her vocal cords were unremarkable. Her PPI therapy was increased to twice daily use and she has not been  using it regularly in the evenings. The cough is somewhat improved on PPI therapy, but still remains.  She continues to drink alcohol each evening. She is drinking iced tea throughout the day with caffeine.  OV 09/17/21 She reports her cough is less severe and it seemed to improved with taking PPI on empty stomach. She is sleeping better and will occasionally wake up due to the cough. She is not eating 2 hours before bedtime. She has not elevated the head of her bed. She continues to have intermittent hoarseness of her voice.    Initial OV 07/10/21 She reports having cough since January. The cough is dry. She reports irritation in her throat and does not feel the cough is coming from her chest. She denies sinus congestion or post nasal drainage. She denies wheezing or shortness of breath with the cough. She also reports hoarseness in her voice. She denies dysphagia or coughing with eating or drinking. She reports the cough can happen at random times during the day and can cause night time awakenings most nights. She denies overt heart burn symptoms. She was trialed on pantoprazole by her PCP without improvement.   Chest radiograph from 05/29/21 is unremarkable. Patient brought in copy of radiograph on CD for review.   She is a never smoker. She works in Engineering geologist. She drinks 1-2 vodka drinks per night. She reports eating dinner after 8pm and usually goes to bed around 10pm.   Past Medical  History:  Diagnosis Date   Hyperlipidemia      Family History  Problem Relation Age of Onset   Non-Hodgkin's lymphoma Mother    Myelodysplastic syndrome Mother    Diabetes Father    Congestive Heart Failure Father    Colon cancer Neg Hx    Esophageal cancer Neg Hx      Social History   Socioeconomic History   Marital status: Married    Spouse name: Not on file   Number of children: 3   Years of education: Not on file   Highest education level: Not on file  Occupational History   Occupation: retail,  and self employed  Tobacco Use   Smoking status: Never   Smokeless tobacco: Never  Vaping Use   Vaping Use: Never used  Substance and Sexual Activity   Alcohol use: Yes    Comment: social   Drug use: Never   Sexual activity: Not on file  Other Topics Concern   Not on file  Social History Narrative   Not on file   Social Determinants of Health   Financial Resource Strain: Not on file  Food Insecurity: Not on file  Transportation Needs: Not on file  Physical Activity: Not on file  Stress: Not on file  Social Connections: Not on file  Intimate Partner Violence: Not on file     No Known Allergies   Outpatient Medications Prior to Visit  Medication Sig Dispense Refill   benzonatate (TESSALON) 200 MG capsule Take 1 capsule (200 mg total) by mouth 3 (three) times daily as needed for cough. 30 capsule 1   buPROPion (WELLBUTRIN SR) 150 MG 12 hr tablet Take 150 mg by mouth 2 (two) times daily.     chlorpheniramine (CHLOR-TRIMETON) 4 MG tablet Take 1 tablet (4 mg total) by mouth at bedtime. 30 tablet 3   dextromethorphan (DELSYM) 30 MG/5ML liquid Take by mouth as needed for cough.     estradiol (VIVELLE-DOT) 0.05 MG/24HR patch estradiol 0.05 mg/24 hr semiweekly transdermal patch  Apply 1 patch twice a week by transdermal route for 84 days.     famotidine (PEPCID) 20 MG tablet One after supper 30 tablet 11   HYDROcodone bit-homatropine (HYCODAN) 5-1.5 MG/5ML syrup Take 5 mLs by mouth every 6 (six) hours as needed for cough. 240 mL 0   ipratropium (ATROVENT) 0.03 % nasal spray Place 2 sprays into both nostrils every 12 (twelve) hours. 30 mL 12   lidocaine (LIDODERM) 5 % Place 1 patch onto the skin daily. Remove & Discard patch within 24 hours as needed. 30 patch 0   ondansetron (ZOFRAN) 4 MG tablet Take 1 tablet (4 mg total) by mouth every 8 (eight) hours as needed for up to 3 doses for nausea or vomiting. 3 tablet 0   pantoprazole (PROTONIX) 40 MG tablet Take 1 tablet (40 mg total) by  mouth daily. 30 tablet 6   progesterone (PROMETRIUM) 100 MG capsule Take 100 mg by mouth daily.     rosuvastatin (CRESTOR) 5 MG tablet Take 5 mg by mouth daily.     spironolactone (ALDACTONE) 50 MG tablet Take 50 mg by mouth daily.     Facility-Administered Medications Prior to Visit  Medication Dose Route Frequency Provider Last Rate Last Admin   0.9 %  sodium chloride infusion  500 mL Intravenous Once Imogene Burn, MD       Review of Systems  Constitutional:  Negative for chills, fever, malaise/fatigue and weight loss.  HENT:  Negative for congestion, sinus pain and sore throat.   Eyes: Negative.   Respiratory:  Positive for cough. Negative for hemoptysis, sputum production, shortness of breath and wheezing.   Cardiovascular:  Negative for chest pain, palpitations, orthopnea, claudication and leg swelling.  Gastrointestinal:  Negative for abdominal pain, heartburn, nausea and vomiting.  Genitourinary: Negative.   Musculoskeletal:  Negative for joint pain and myalgias.  Skin:  Negative for rash.  Neurological:  Negative for weakness.  Endo/Heme/Allergies: Negative.   Psychiatric/Behavioral: Negative.      Objective:   Vitals:   07/26/22 1126  BP: 116/72  Pulse: 77  SpO2: 99%  Weight: 143 lb (64.9 kg)  Height: 5' 3.5" (1.613 m)     Physical Exam Constitutional:      General: She is not in acute distress.    Appearance: She is not ill-appearing.  HENT:     Head: Normocephalic and atraumatic.     Nose: Nose normal.     Mouth/Throat:     Mouth: Mucous membranes are moist.  Cardiovascular:     Rate and Rhythm: Normal rate and regular rhythm.     Pulses: Normal pulses.     Heart sounds: Normal heart sounds. No murmur heard. Pulmonary:     Effort: Pulmonary effort is normal.     Breath sounds: Normal breath sounds. No wheezing, rhonchi or rales.  Musculoskeletal:     Right lower leg: No edema.     Left lower leg: No edema.  Skin:    General: Skin is warm and dry.   Neurological:     General: No focal deficit present.     Mental Status: She is alert.    CBC    Component Value Date/Time   WBC 8.6 02/06/2022 1028   RBC 4.02 02/06/2022 1028   HGB 12.6 02/06/2022 1028   HCT 36.8 02/06/2022 1028   PLT 395.0 02/06/2022 1028   MCV 91.7 02/06/2022 1028   MCHC 34.1 02/06/2022 1028   RDW 12.7 02/06/2022 1028   LYMPHSABS 1.5 02/06/2022 1028   MONOABS 0.9 02/06/2022 1028   EOSABS 0.2 02/06/2022 1028   BASOSABS 0.0 02/06/2022 1028   Chest imaging: CT Chest 11/15/21 1. No evidence of interstitial lung disease. 2. Scattered subpleural nodules measure up to 5 mm. No follow-up needed if patient is low-risk (and has no known or suspected primary neoplasm). Non-contrast chest CT can be considered in 12 months if patient is high-risk. This recommendation follows the consensus statement: Guidelines for Management of Incidental Pulmonary Nodules Detected on CT Images: From the Fleischner Society 2017; Radiology 2017; 284:228-243. 3.  Aortic atherosclerosis (ICD10-I70.0).  CXR 05/29/21 Clear lungs bilaterally  PFT:     No data to display           Labs:  Path:  Echo:  Heart Catheterization:  Modified Barium Swallow 01/25/22 Pt presents with normal oropharyngeal swallow function. There is thorough mastication and adequate bolus control. timely onset of the pharyngeal swallow, reliable laryngeal vestibule closure without penetration/aspiration, strong pharyngeal contraction and clearance. After the study we discussed the possible contributions to cough, including LPR. We reviewed LPR and GERD and the inflammation/irritation to esophagus and in particular, the larynx, that can incite cough. We discussed trying a strict low-acid diet for thirty days and she was given a handout specifying some cough suppression techniques she can try. If she needs more structured support, she could be referred to [facility] OP SLP on [address]. Pt agrees with plan.  Assessment & Plan:   No diagnosis found.  Discussion: Vanessa Harris is a 67 year old woman, never smoker who returns to pulmonary clinic for chronic cough.   Her cough appears related to GERD and laryngopharyngeal reflux based on ENT evaluation and likely upper airway cough syndrome driven mainly by chronic throat clearing and post-nasal draiange.   She is to 40mg  pantoprazole 30 minutes before breakfast and 20mg  famotidine at bedtime. She is being evaluated by GI in the near future. She has reportedly made necessary diet changes and sleeps elevated at night without change in her cough.   She is to resume ipratropium nasal spray and start chlorpheniramine 4mg  at bedtime.   She can continue tesalon perles and hycodan cough syrup as needed.   She would likely benefit from outpatient speech therapy. Will place this referral once he have more info from her GI evaluation.   Follow up in 3 months.   Melody Comas, MD [facility] Office: [phone]    Current Outpatient Medications:    benzonatate (TESSALON) 200 MG capsule, Take 1 capsule (200 mg total) by mouth 3 (three) times daily as needed for cough., Disp: 30 capsule, Rfl: 1   buPROPion (WELLBUTRIN SR) 150 MG 12 hr tablet, Take 150 mg by mouth 2 (two) times daily., Disp: , Rfl:    chlorpheniramine (CHLOR-TRIMETON) 4 MG tablet, Take 1 tablet (4 mg total) by mouth at bedtime., Disp: 30 tablet, Rfl: 3   dextromethorphan (DELSYM) 30 MG/5ML liquid, Take by mouth as needed for cough., Disp: , Rfl:    estradiol (VIVELLE-DOT) 0.05 MG/24HR patch, estradiol 0.05 mg/24 hr semiweekly transdermal patch  Apply 1 patch twice a week by transdermal route for 84 days., Disp: , Rfl:    famotidine (PEPCID) 20 MG tablet, One after supper, Disp: 30 tablet, Rfl: 11   HYDROcodone bit-homatropine (HYCODAN) 5-1.5 MG/5ML syrup, Take 5 mLs by mouth every 6 (six) hours as needed for cough., Disp: 240 mL, Rfl: 0   ipratropium  (ATROVENT) 0.03 % nasal spray, Place 2 sprays into both nostrils every 12 (twelve) hours., Disp: 30 mL, Rfl: 12   lidocaine (LIDODERM) 5 %, Place 1 patch onto the skin daily. Remove & Discard patch within 24 hours as needed., Disp: 30 patch, Rfl: 0   ondansetron (ZOFRAN) 4 MG tablet, Take 1 tablet (4 mg total) by mouth every 8 (eight) hours as needed for up to 3 doses for nausea or vomiting., Disp: 3 tablet, Rfl: 0   pantoprazole (PROTONIX) 40 MG tablet, Take 1 tablet (40 mg total) by mouth daily., Disp: 30 tablet, Rfl: 6   progesterone (PROMETRIUM) 100 MG capsule, Take 100 mg by mouth daily., Disp: , Rfl:    rosuvastatin (CRESTOR) 5 MG tablet, Take 5 mg by mouth daily., Disp: , Rfl:    spironolactone (ALDACTONE) 50 MG tablet, Take 50 mg by mouth daily., Disp: , Rfl:   Current Facility-Administered Medications:    0.9 %  sodium chloride infusion, 500 mL, Intravenous, Once, Imogene Burn, MD

## 2022-07-28 ENCOUNTER — Encounter: Payer: Self-pay | Admitting: Pulmonary Disease

## 2022-07-31 ENCOUNTER — Telehealth: Payer: Self-pay

## 2022-07-31 NOTE — Telephone Encounter (Signed)
-----   Message from Imogene Burn, MD sent at 07/31/2022  1:11 PM EDT ----- Regarding: RE: Chronic Cough Randie Heinz, thanks Beth! ----- Message ----- From: Evalee Jefferson, LPN Sent: 03/02/1094  10:59 AM EDT To: Imogene Burn, MD Subject: RE: Chronic Cough                              I offered her this Friday for an office visit, she declined. I have her scheduled to see you in July.  ----- Message ----- From: Imogene Burn, MD Sent: 07/29/2022  10:45 AM EDT To: Evalee Jefferson, LPN; Martina Sinner, MD Subject: RE: Chronic Cough                              Yes, we can get her set up for pH testing if she is amenable. Beth, let's get in for an OV follow up ----- Message ----- From: Martina Sinner, MD Sent: 07/26/2022  12:21 PM EDT To: Imogene Burn, MD Subject: Chronic Cough                                  Hi Dr. Leonides Schanz,   I have been seeing patient for chronic cough which I believe is related to reflux. I reviewed her recent EGD results. Would she benefit from further evaluation of reflux with pH monitoring for consideration of nissen fundoplication surgery?  Thanks, Cletis Athens [phone]

## 2022-08-08 ENCOUNTER — Other Ambulatory Visit: Payer: Self-pay | Admitting: Obstetrics and Gynecology

## 2022-08-08 DIAGNOSIS — R928 Other abnormal and inconclusive findings on diagnostic imaging of breast: Secondary | ICD-10-CM

## 2022-08-12 ENCOUNTER — Ambulatory Visit
Admission: RE | Admit: 2022-08-12 | Discharge: 2022-08-12 | Disposition: A | Discharge status: Home / Self Care | Payer: Medicare Other | Source: Ambulatory Visit | Attending: Obstetrics and Gynecology | Admitting: Obstetrics and Gynecology

## 2022-08-12 DIAGNOSIS — R928 Other abnormal and inconclusive findings on diagnostic imaging of breast: Secondary | ICD-10-CM

## 2022-08-19 ENCOUNTER — Other Ambulatory Visit: Payer: Medicare Other

## 2022-09-02 ENCOUNTER — Ambulatory Visit: Payer: Medicare Other | Admitting: Internal Medicine

## 2022-09-02 ENCOUNTER — Encounter: Payer: Self-pay | Admitting: Internal Medicine

## 2022-09-02 VITALS — BP 106/64 | HR 77 | Ht 63.5 in | Wt 144.0 lb

## 2022-09-02 DIAGNOSIS — K449 Diaphragmatic hernia without obstruction or gangrene: Secondary | ICD-10-CM

## 2022-09-02 DIAGNOSIS — Z1211 Encounter for screening for malignant neoplasm of colon: Secondary | ICD-10-CM

## 2022-09-02 DIAGNOSIS — R053 Chronic cough: Secondary | ICD-10-CM

## 2022-09-02 DIAGNOSIS — K219 Gastro-esophageal reflux disease without esophagitis: Secondary | ICD-10-CM | POA: Diagnosis not present

## 2022-09-02 MED ORDER — BENZONATATE 200 MG PO CAPS
200.00 mg | ORAL_CAPSULE | Freq: Three times a day (TID) | ORAL | 1 refills | Status: AC | PRN
Start: 2022-09-02 — End: ?

## 2022-09-02 MED ORDER — LINACLOTIDE 72 MCG PO CAPS: 72.0000 ug | ORAL_CAPSULE | Freq: Every day | ORAL | 3 refills | Status: DC

## 2022-09-02 NOTE — Progress Notes (Signed)
Chief Complaint: GERD, cough  HPI : 67 year old female with history of chronic cough presents for follow up of cough and GERD.  Interval History: She initially had a dramatic improvement in her cough after being started on twice daily pantoprazole.  However she has recently been on vacation and has had a recurrence in her chronic cough.  It is possible that due to liberalization of her diet during her vacation, her reflux may have been exacerbated.  Her cough is now as bad as it has been in the past.  Random strangers will ask her if she is okay due to her coughing episodes.  She does continue to take twice daily pantoprazole but has not found this to be as effective for controlling her cough.  She still denies any chest burning or regurgitation.  She recently was found to have a poor prep on her colonoscopy, but she knows that she had difficulty with the prep.  She does not have a bowel movement every day.  Denies any blood in the stools, changes in her bowel habits, or weight loss.  Wt Readings from Last 3 Encounters:  09/02/22 144 lb (65.3 kg)  07/26/22 143 lb (64.9 kg)  05/17/22 143 lb (64.9 kg)   Past Medical History:  Diagnosis Date   Hyperlipidemia    Past Surgical History:  Procedure Laterality Date   INGUINAL HERNIA REPAIR Left    Family History  Problem Relation Age of Onset   Non-Hodgkin's lymphoma Mother    Myelodysplastic syndrome Mother    Diabetes Father    Congestive Heart Failure Father    Colon cancer Neg Hx    Esophageal cancer Neg Hx    Social History   Tobacco Use   Smoking status: Never   Smokeless tobacco: Never  Vaping Use   Vaping Use: Never used  Substance Use Topics   Alcohol use: Yes    Comment: social   Drug use: Never   Current Outpatient Medications  Medication Sig Dispense Refill   benzonatate (TESSALON) 200 MG capsule Take 1 capsule (200 mg total) by mouth 3 (three) times daily as needed for cough. 30 capsule 1   buPROPion (WELLBUTRIN  SR) 150 MG 12 hr tablet Take 150 mg by mouth 2 (two) times daily.     chlorpheniramine (CHLOR-TRIMETON) 4 MG tablet Take 1 tablet (4 mg total) by mouth at bedtime. 30 tablet 3   dextromethorphan (DELSYM) 30 MG/5ML liquid Take by mouth as needed for cough.     estradiol (VIVELLE-DOT) 0.05 MG/24HR patch estradiol 0.05 mg/24 hr semiweekly transdermal patch  Apply 1 patch twice a week by transdermal route for 84 days.     famotidine (PEPCID) 20 MG tablet One after supper 30 tablet 11   HYDROcodone bit-homatropine (HYCODAN) 5-1.5 MG/5ML syrup Take 5 mLs by mouth every 6 (six) hours as needed for cough. 240 mL 0   ipratropium (ATROVENT) 0.03 % nasal spray Place 2 sprays into both nostrils every 12 (twelve) hours. 30 mL 12   lidocaine (LIDODERM) 5 % Place 1 patch onto the skin daily. Remove & Discard patch within 24 hours as needed. 30 patch 0   ondansetron (ZOFRAN) 4 MG tablet Take 1 tablet (4 mg total) by mouth every 8 (eight) hours as needed for up to 3 doses for nausea or vomiting. 3 tablet 0   pantoprazole (PROTONIX) 40 MG tablet Take 1 tablet (40 mg total) by mouth 2 (two) times daily. 60 tablet 1   progesterone (  PROMETRIUM) 100 MG capsule Take 100 mg by mouth daily.     rosuvastatin (CRESTOR) 5 MG tablet Take 5 mg by mouth daily.     spironolactone (ALDACTONE) 50 MG tablet Take 100 mg by mouth daily.     Current Facility-Administered Medications  Medication Dose Route Frequency Provider Last Rate Last Admin   0.9 %  sodium chloride infusion  500 mL Intravenous Once Imogene Burn, MD       No Known Allergies  Review of Systems: All systems reviewed and negative except where noted in HPI.   Physical Exam: BP 106/64   Pulse 77   Ht 5' 3.5" (1.613 m)   Wt 144 lb (65.3 kg)   BMI 25.11 kg/m  Constitutional: Pleasant,well-developed, female in no acute distress. HEENT: Normocephalic and atraumatic. Conjunctivae are normal. No scleral icterus. Cardiovascular: Normal rate, regular rhythm.   Pulmonary/chest: Effort normal and breath sounds normal. No wheezing, rales or rhonchi. Abdominal: Soft, nondistended, nontender. Bowel sounds active throughout. There are no masses palpable. No hepatomegaly. Extremities: No edema Neurological: Alert and oriented to person place and time. Skin: Skin is warm and dry. No rashes noted. Psychiatric: Normal mood and affect. Behavior is normal.  Labs 01/2022: CBC nml.   Chest CT 11/15/21: IMPRESSION: 1. No evidence of interstitial lung disease. 2. Scattered subpleural nodules measure up to 5 mm. No follow-up needed if patient is low-risk (and has no known or suspected primary neoplasm). Non-contrast chest CT can be considered in 12 months if patient is high-risk. This recommendation follows the consensus statement: Guidelines for Management of Incidental Pulmonary Nodules Detected on CT Images: From the Fleischner Society 2017; Radiology 2017; 284:228-243. 3.  Aortic atherosclerosis (ICD10-I70.0)  Modified barium swallow 01/25/22: IMPRESSION: Normal modified barium swallow.  EGD 05/17/22:  Path: 1. Surgical [P], gastric bx - CHRONIC INACTIVE GASTRITIS - NEGATIVE FOR H.PYLORI ON IMMUNOHISTOCHEMICAL STAIN - NEGATIVE FOR INTESTINAL METAPLASIA, DYSPLASIA OR MALIGNANCY 2. Surgical [P], esophagus bx - SQUAMOUS MUCOSA WITH NO SPECIFIC PATHOLOGIC CHANGES - NEGATIVE FOR INCREASED INTRAEPITHELIAL EOSINOPHILS - NEGATIVE FOR DYSPLASIA OR MALIGNANCY  Colonoscopy 05/17/22:   ASSESSMENT AND PLAN: Chronic cough GERD Hiatal hernia Constipation Colon cancer screening Patient presents for follow-up of chronic cough, which has worsened again recently despite taking PPI and H2 blocker therapy.  Her pulmonologist recommended that she undergo pH testing.  Thus we will get her set up for an EGD with [facility] to see if the patient has true acid reflux.  Since patient has never had colon polyps in the past and does not have family history of colon cancer,  will have the patient do a Cologuard for colon cancer screening.  I do suspect that based upon her recent poor prep during her colonoscopy that she has underlying constipation issues.  Thus we will start her on some daily Linzess. - GERD handout - Cont pantoprazole 40 mg BID and famotidine 20 mg at bedtime.  Encouraged her to take her pantoprazole before meals if possible. - Start daily Linzess 72 mcg QD - EGD LEC with [facility]  - Cologuard - RTC 3 months  Eulah Pont, MD  I spent 43 minutes of time, including in depth chart review, independent review of results as outlined above, communicating results with the patient directly, face-to-face time with the patient, coordinating care, ordering studies and medications as appropriate, and documentation.

## 2022-09-02 NOTE — Patient Instructions (Addendum)
It has been recommended to you by your physician that you have a(n) Endoscopy completed. Per your request, we did not schedule the procedure(s) today. Please contact our office at [phone] should you decide to have the procedure completed. You will be scheduled for a pre-visit and procedure at that time.   Call to scheduled your 3 months follow up visit Your provider has ordered Cologuard testing as an option for colon cancer screening. This is performed by [employer] and may be out of network with your insurance. PRIOR to completing the test, it is YOUR responsibility to contact your insurance about covered benefits for this test. Your out of pocket expense could be anywhere from $0.00 to $649.00.   When you call to check coverage with your insurer, please provide the following information:   -The ONLY provider of Cologuard is [facility], dispensing  - CPT code for Cologuard is [phone].  [facility] NPI # [id number]  -[employer] Tax ID # [id number]   We have already sent your demographic and insurance information to [employer] (phone number [phone]) and they should contact you within the next week regarding your test. If you have not heard from them within the next week, please call our office at [phone].   We have sent the following medications to your pharmacy for you to pick up at your convenience: Linzess  If your blood pressure at your visit was 140/90 or greater, please contact your primary care physician to follow up on this.  _______________________________________________________  If you are age 51 or older, your body mass index should be between 23-30. Your Body mass index is 25.11 kg/m. If this is out of the aforementioned range listed, please consider follow up with your Primary Care Provider.  If you are age 74 or younger, your body mass index should be between 19-25. Your Body mass index is 25.11 kg/m. If this is out  of the aformentioned range listed, please consider follow up with your Primary Care Provider.   ________________________________________________________  The [facility] providers would like to encourage you to use [facility] to communicate with providers for non-urgent requests or questions.  Due to long hold times on the telephone, sending your provider a message by [facility] may be a faster and more efficient way to get a response.  Please allow 48 business hours for a response.  Please remember that this is for non-urgent requests.  _______________________________________________________   Thank you for entrusting me with your care and for choosing [facility], Dr. Eulah Pont

## 2022-09-03 ENCOUNTER — Telehealth: Payer: Self-pay | Admitting: Pulmonary Disease

## 2022-09-03 DIAGNOSIS — R053 Chronic cough: Secondary | ICD-10-CM

## 2022-09-03 NOTE — Telephone Encounter (Signed)
Pt needs a refill for HYDROcodone bit-homatropine (HYCODAN) 5-1.5 MG/5ML syrup   Pharmacy : [facility] on [address]

## 2022-09-04 NOTE — Telephone Encounter (Signed)
Pt calling back bc she needs a refill for HYDROcodone bit-homatropine (HYCODAN) 5-1.5 MG/5ML syrup   Pharmacy : [facility] on [address]

## 2022-09-05 MED ORDER — HYDROCODONE BIT-HOMATROP MBR 5-1.5 MG/5ML PO SOLN
5.0000 mL | Freq: Four times a day (QID) | ORAL | 0 refills | Status: DC | PRN
Start: 2022-09-05 — End: 2022-10-16

## 2022-09-05 NOTE — Telephone Encounter (Signed)
Prescription refilled.

## 2022-09-09 NOTE — Telephone Encounter (Signed)
Spoke with patient. She was able to receive Hycodan cough syrup. NFN at this time

## 2022-09-23 ENCOUNTER — Other Ambulatory Visit: Payer: Self-pay | Admitting: Pulmonary Disease

## 2022-09-23 DIAGNOSIS — K219 Gastro-esophageal reflux disease without esophagitis: Secondary | ICD-10-CM

## 2022-10-05 ENCOUNTER — Other Ambulatory Visit: Payer: Self-pay | Admitting: Internal Medicine

## 2022-10-05 DIAGNOSIS — R053 Chronic cough: Secondary | ICD-10-CM

## 2022-10-09 ENCOUNTER — Telehealth: Payer: Self-pay | Admitting: Pulmonary Disease

## 2022-10-09 DIAGNOSIS — R053 Chronic cough: Secondary | ICD-10-CM

## 2022-10-09 NOTE — Telephone Encounter (Signed)
PT needs a refill of the following:  HYDROcodone bit-homatropine (HYCODAN) 5-1.5 MG/5ML syrup [[id number]]   Pharm is [facility] in [place]

## 2022-10-14 NOTE — Telephone Encounter (Signed)
Patient called needs a refill on her medication hydrocodone. She is concerned because she hasn't heard anything back from last week. [facility] in [place].

## 2022-10-15 ENCOUNTER — Telehealth: Payer: Self-pay

## 2022-10-15 NOTE — Telephone Encounter (Addendum)
 Called patient to discuss lab results no answer left message on voice mail to call office back.

## 2022-10-15 NOTE — Telephone Encounter (Signed)
Patient returned your call, please advise. 

## 2022-10-16 ENCOUNTER — Telehealth: Payer: Self-pay

## 2022-10-16 MED ORDER — HYDROCODONE BIT-HOMATROP MBR 5-1.5 MG/5ML PO SOLN
5.0000 mL | Freq: Four times a day (QID) | ORAL | 0 refills | Status: AC | PRN
Start: 2022-10-16 — End: ?

## 2022-10-16 NOTE — Telephone Encounter (Signed)
Patient is requesting a call back. Please advise

## 2022-10-16 NOTE — Telephone Encounter (Signed)
2nd attempt to contact  patient regarding lab results left message on voice mail to call office back

## 2022-10-16 NOTE — Telephone Encounter (Signed)
Pt needs a refill for HYDROcodone bit-homatropine (HYCODAN) 5-1.5 MG/5ML syrup [[id number]]   Pharmacy: [facility] in [place].  Pt is going out of town tomorrow

## 2022-10-16 NOTE — Telephone Encounter (Signed)
Refill sent.

## 2022-12-13 ENCOUNTER — Ambulatory Visit: Payer: Medicare Other | Admitting: Internal Medicine

## 2022-12-22 ENCOUNTER — Other Ambulatory Visit: Payer: Self-pay | Admitting: Internal Medicine

## 2023-01-01 ENCOUNTER — Ambulatory Visit: Payer: Medicare Other | Admitting: Pulmonary Disease

## 2023-01-01 ENCOUNTER — Encounter (HOSPITAL_BASED_OUTPATIENT_CLINIC_OR_DEPARTMENT_OTHER): Payer: Medicare Other

## 2023-02-03 ENCOUNTER — Other Ambulatory Visit: Payer: Self-pay | Admitting: Internal Medicine

## 2023-03-10 ENCOUNTER — Other Ambulatory Visit: Payer: Self-pay | Admitting: Pulmonary Disease

## 2023-03-10 DIAGNOSIS — K219 Gastro-esophageal reflux disease without esophagitis: Secondary | ICD-10-CM

## 2023-06-17 ENCOUNTER — Other Ambulatory Visit: Payer: Self-pay | Admitting: Internal Medicine

## 2023-08-08 ENCOUNTER — Other Ambulatory Visit: Payer: Self-pay | Admitting: Family

## 2023-08-08 DIAGNOSIS — R1084 Generalized abdominal pain: Secondary | ICD-10-CM

## 2023-08-12 ENCOUNTER — Ambulatory Visit
Admission: RE | Admit: 2023-08-12 | Discharge: 2023-08-12 | Disposition: A | Discharge status: Home / Self Care | Source: Ambulatory Visit | Attending: Family | Admitting: Family

## 2023-08-12 DIAGNOSIS — R1084 Generalized abdominal pain: Secondary | ICD-10-CM

## 2023-08-21 ENCOUNTER — Ambulatory Visit
Admission: RE | Admit: 2023-08-21 | Discharge: 2023-08-21 | Disposition: A | Discharge status: Home / Self Care | Source: Ambulatory Visit | Attending: Family | Admitting: Family

## 2023-09-05 ENCOUNTER — Other Ambulatory Visit: Payer: Self-pay | Admitting: Endocrinology

## 2023-09-05 DIAGNOSIS — M5416 Radiculopathy, lumbar region: Secondary | ICD-10-CM

## 2023-10-12 ENCOUNTER — Other Ambulatory Visit: Payer: Self-pay | Admitting: Internal Medicine

## 2023-10-14 ENCOUNTER — Other Ambulatory Visit

## 2023-10-17 ENCOUNTER — Other Ambulatory Visit: Payer: Self-pay | Admitting: Endocrinology

## 2023-10-17 DIAGNOSIS — M5416 Radiculopathy, lumbar region: Secondary | ICD-10-CM

## 2023-11-08 ENCOUNTER — Other Ambulatory Visit: Payer: Self-pay | Admitting: Internal Medicine

## 2023-11-14 ENCOUNTER — Ambulatory Visit
Admission: RE | Admit: 2023-11-14 | Discharge: 2023-11-14 | Disposition: A | Discharge status: Home / Self Care | Source: Ambulatory Visit | Attending: Endocrinology | Admitting: Endocrinology

## 2023-11-14 DIAGNOSIS — M5416 Radiculopathy, lumbar region: Secondary | ICD-10-CM

## 2023-11-14 MED ORDER — GADOPICLENOL 0.5 MMOL/ML IV SOLN
6.5000 mL | Freq: Once | INTRAVENOUS | Status: AC | PRN
Start: 2023-11-14 — End: 2023-11-14
  Administered 2023-11-14: 6.5 mL via INTRAVENOUS

## 2024-03-04 ENCOUNTER — Telehealth: Payer: Self-pay

## 2024-03-04 NOTE — Telephone Encounter (Signed)
 Patient called and would liek to make an appointment for numbness and swelling in right foot. She has not been here since 2021.

## 2024-03-11 ENCOUNTER — Ambulatory Visit: Admitting: Podiatry

## 2024-03-17 ENCOUNTER — Ambulatory Visit

## 2024-03-17 ENCOUNTER — Encounter: Payer: Self-pay | Admitting: Podiatry

## 2024-03-17 ENCOUNTER — Ambulatory Visit: Admitting: Podiatry

## 2024-03-17 VITALS — Ht 63.5 in | Wt 144.0 lb

## 2024-03-17 DIAGNOSIS — M76821 Posterior tibial tendinitis, right leg: Secondary | ICD-10-CM | POA: Diagnosis not present

## 2024-03-17 DIAGNOSIS — M7751 Other enthesopathy of right foot: Secondary | ICD-10-CM

## 2024-03-19 ENCOUNTER — Encounter: Payer: Self-pay | Admitting: Podiatry

## 2024-03-23 NOTE — Progress Notes (Signed)
" ° °  Chief Complaint  Patient presents with   Foot Pain    Pt is here due to right foot pain/swelling that has been going on for a few weeks, was seen at [facility] was told there was something going on with the tendon,states the pain and swelling is still there.    HPI: 69 y.o. femalepresenting for second opinion regarding pain and tenderness to the right foot and ankle ongoing for several months despite conservative care.  She has had it immobilized in the boot with no improvement.  She is very frustrated with the pain that she is experiencing.  Presenting for second opinion  Past Medical History:  Diagnosis Date   Hyperlipidemia     Past Surgical History:  Procedure Laterality Date   INGUINAL HERNIA REPAIR Left     Allergies[1]   Physical Exam: General: The patient is alert and oriented x3 in no acute distress.  Dermatology: Skin is warm, dry and supple bilateral lower extremities.   Vascular: Palpable pedal pulses bilaterally. Capillary refill within normal limits.  No appreciable edema.  No erythema.  Neurological: Grossly intact via light touch  Musculoskeletal Exam: No pedal deformities noted.  Diffuse swelling with tenderness to palpation along posterior tibial tendon and diffusely around the ankle  Radiographic Exam RT foot 03/17/2024:  Normal osseous mineralization.  Orthopedic screws noted to the first metatarsal and fifth metatarsal with good healing of the osteotomies from prior bunion and tailor's bunion surgery.  No complicating features noted.  Assessment/Plan of Care: 1.  Posterior tibial tendinitis with significant pain and swelling around the right ankle  - Patient evaluated.  X-rays reviewed -Due to the significant mount of pain with swelling I do believe that MRI is warranted to evaluate the posterior tibial tendon and possible partial tearing of the tendon. -Order placed for MRI RT ankle wo contrast -Return to clinic after MRI to review the results and  discuss further treatment options     Thresa EMERSON Sar, DPM [facility]  Dr. Thresa EMERSON Sar, DPM    [address] [place], [place] [address]                Office [phone]  Fax [phone]        [1] No Known Allergies  "

## 2024-03-24 ENCOUNTER — Inpatient Hospital Stay: Admission: RE | Admit: 2024-03-24 | Discharge: 2024-03-24 | Attending: Podiatry

## 2024-03-24 ENCOUNTER — Other Ambulatory Visit

## 2024-03-24 DIAGNOSIS — M76821 Posterior tibial tendinitis, right leg: Secondary | ICD-10-CM

## 2024-03-28 ENCOUNTER — Telehealth: Payer: Self-pay | Admitting: Podiatry

## 2024-03-28 NOTE — Telephone Encounter (Signed)
 Can you please call pt with results of MRI

## 2024-03-29 ENCOUNTER — Ambulatory Visit: Admitting: Podiatry

## 2024-04-05 ENCOUNTER — Ambulatory Visit: Admitting: Podiatry
